# Patient Record
Sex: Female | Born: 1950 | Race: White | Hispanic: No | Smoking: Former smoker
Health system: Southern US, Community
[De-identification: ages and names within clinical notes are randomized; demographics above are authoritative.]

## PROBLEM LIST (undated history)

## (undated) DIAGNOSIS — C801 Malignant (primary) neoplasm, unspecified: Secondary | ICD-10-CM

## (undated) DIAGNOSIS — M5416 Radiculopathy, lumbar region: Secondary | ICD-10-CM

## (undated) DIAGNOSIS — K759 Inflammatory liver disease, unspecified: Secondary | ICD-10-CM

## (undated) DIAGNOSIS — M79606 Pain in leg, unspecified: Secondary | ICD-10-CM

## (undated) DIAGNOSIS — C541 Malignant neoplasm of endometrium: Secondary | ICD-10-CM

## (undated) DIAGNOSIS — M7989 Other specified soft tissue disorders: Secondary | ICD-10-CM

## (undated) DIAGNOSIS — F32A Depression, unspecified: Secondary | ICD-10-CM

## (undated) DIAGNOSIS — M1990 Unspecified osteoarthritis, unspecified site: Secondary | ICD-10-CM

## (undated) DIAGNOSIS — I10 Essential (primary) hypertension: Secondary | ICD-10-CM

## (undated) DIAGNOSIS — I499 Cardiac arrhythmia, unspecified: Secondary | ICD-10-CM

## (undated) DIAGNOSIS — I81 Portal vein thrombosis: Secondary | ICD-10-CM

## (undated) DIAGNOSIS — K7460 Unspecified cirrhosis of liver: Secondary | ICD-10-CM

## (undated) DIAGNOSIS — K922 Gastrointestinal hemorrhage, unspecified: Secondary | ICD-10-CM

## (undated) DIAGNOSIS — F329 Major depressive disorder, single episode, unspecified: Secondary | ICD-10-CM

## (undated) DIAGNOSIS — M797 Fibromyalgia: Secondary | ICD-10-CM

## (undated) DIAGNOSIS — F419 Anxiety disorder, unspecified: Secondary | ICD-10-CM

## (undated) HISTORY — DX: Portal vein thrombosis: I81

## (undated) HISTORY — DX: Radiculopathy, lumbar region: M54.16

## (undated) HISTORY — DX: Gastrointestinal hemorrhage, unspecified: K92.2

## (undated) HISTORY — PX: MELANOMA EXCISION: SHX5266

## (undated) HISTORY — PX: BRAIN SURGERY: SHX531

## (undated) HISTORY — DX: Major depressive disorder, single episode, unspecified: F32.9

## (undated) HISTORY — DX: Unspecified osteoarthritis, unspecified site: M19.90

## (undated) HISTORY — PX: SPLENECTOMY, TOTAL: SHX788

## (undated) HISTORY — DX: Malignant (primary) neoplasm, unspecified: C80.1

## (undated) HISTORY — DX: Other specified soft tissue disorders: M79.89

## (undated) HISTORY — DX: Depression, unspecified: F32.A

## (undated) HISTORY — PX: ABDOMINAL HYSTERECTOMY: SHX81

## (undated) HISTORY — DX: Essential (primary) hypertension: I10

## (undated) HISTORY — DX: Inflammatory liver disease, unspecified: K75.9

## (undated) HISTORY — DX: Unspecified cirrhosis of liver: K74.60

## (undated) HISTORY — DX: Anxiety disorder, unspecified: F41.9

## (undated) HISTORY — DX: Malignant neoplasm of endometrium: C54.1

## (undated) HISTORY — DX: Cardiac arrhythmia, unspecified: I49.9

## (undated) HISTORY — DX: Pain in leg, unspecified: M79.606

## (undated) HISTORY — DX: Fibromyalgia: M79.7

---

## 2007-06-19 ENCOUNTER — Ambulatory Visit: Payer: Self-pay | Admitting: Gastroenterology

## 2008-05-13 ENCOUNTER — Ambulatory Visit: Payer: Self-pay

## 2008-08-02 ENCOUNTER — Ambulatory Visit: Payer: Self-pay | Admitting: Gastroenterology

## 2008-09-28 ENCOUNTER — Ambulatory Visit: Payer: Self-pay | Admitting: Internal Medicine

## 2010-06-19 ENCOUNTER — Ambulatory Visit: Payer: Self-pay | Admitting: Internal Medicine

## 2011-05-16 ENCOUNTER — Ambulatory Visit: Payer: Self-pay | Admitting: Internal Medicine

## 2012-10-20 DIAGNOSIS — B1920 Unspecified viral hepatitis C without hepatic coma: Secondary | ICD-10-CM | POA: Insufficient documentation

## 2012-12-01 IMAGING — CT CT ABDOMEN WO/W CM
2 of 4 series · 10 of 32 positions shown, 15 images · non-contrast
Comparison: none

REASON FOR EXAM: fibrosis
COMMENTS:

[Series 2: wo · axial · 0.79mm/px · z∈[+354,+554]mm · 8 of 52 slices shown, 13 images]
[im 6/52  soft-tissue]
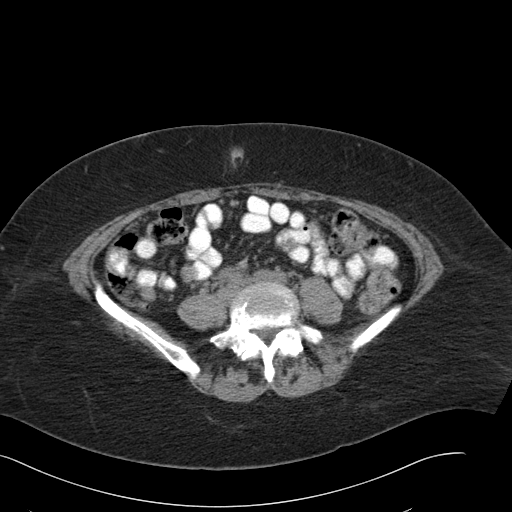
[im 6/52  bone]
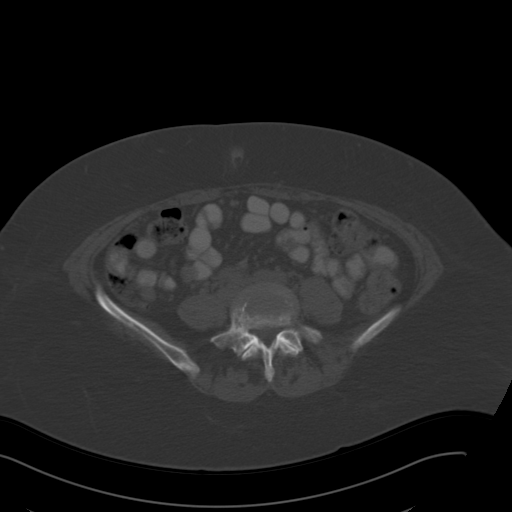
[im 12/52  soft-tissue]
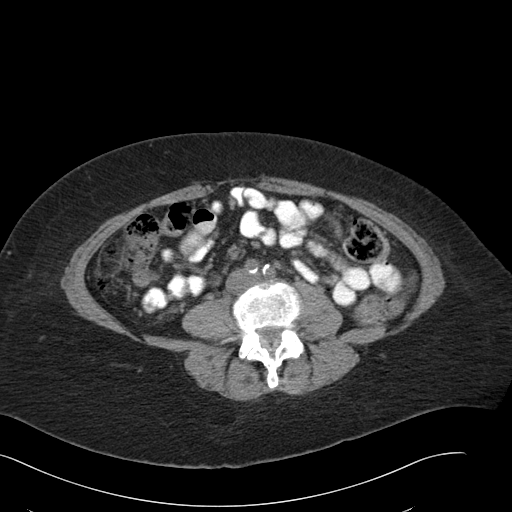
[im 18/52  soft-tissue]
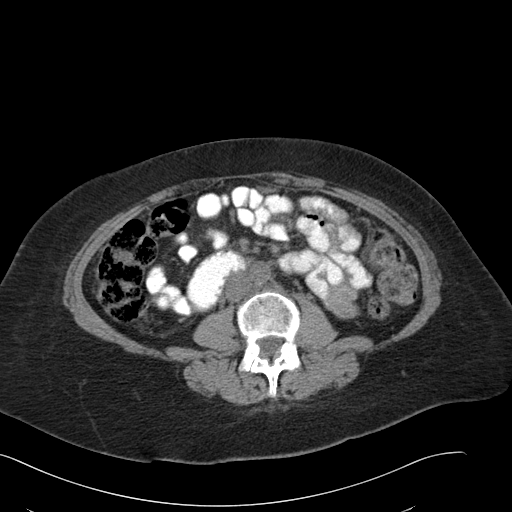
[im 23/52  soft-tissue]
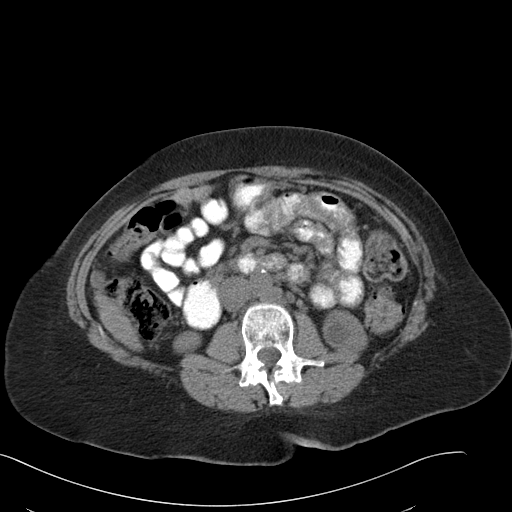
[im 29/52  soft-tissue]
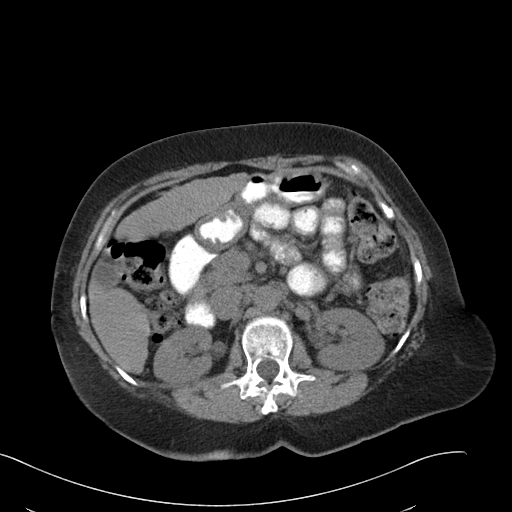
[im 29/52  lung]
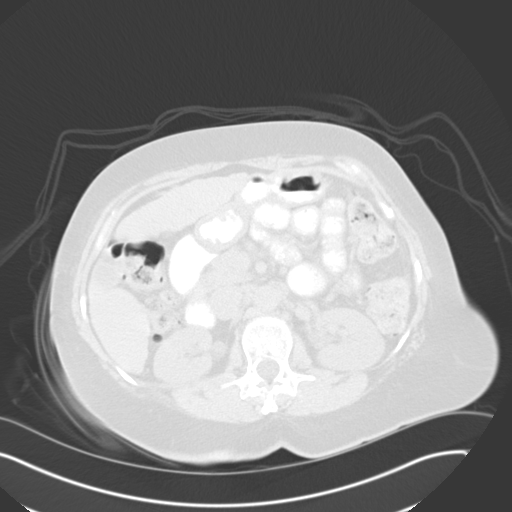
[im 35/52  soft-tissue]
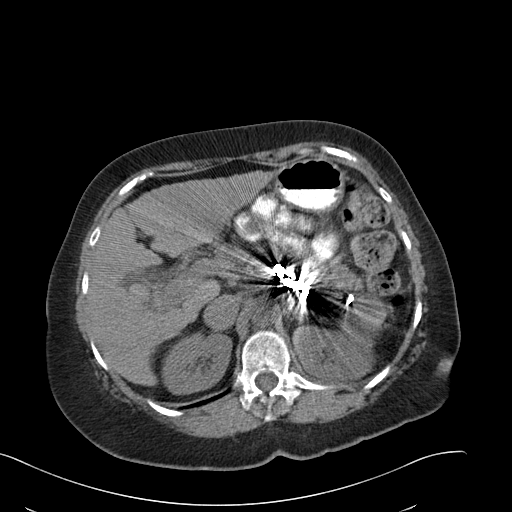
[im 35/52  lung]
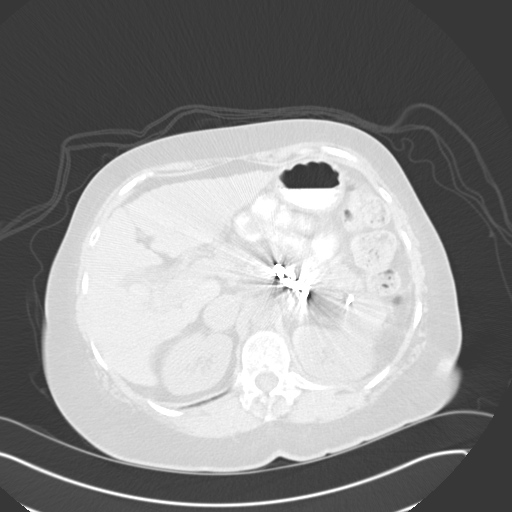
[im 40/52  soft-tissue]
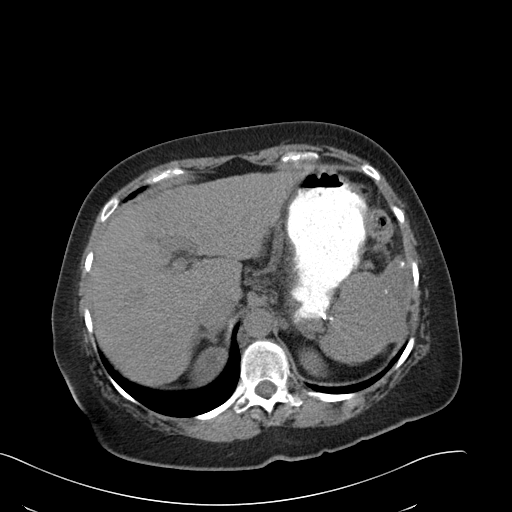
[im 40/52  lung]
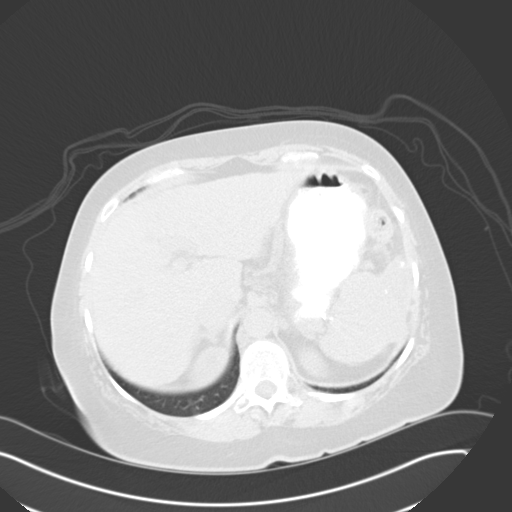
[im 46/52  soft-tissue]
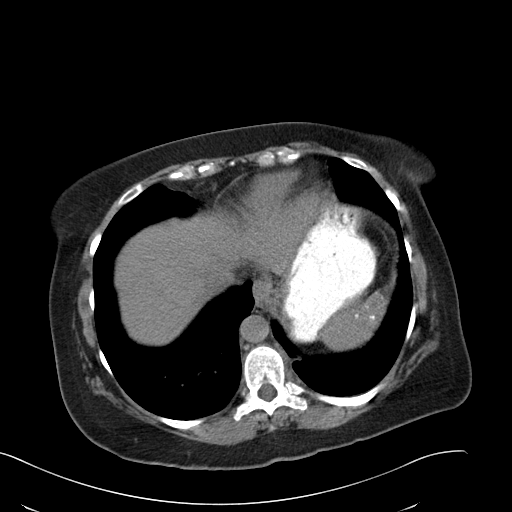
[im 46/52  lung]
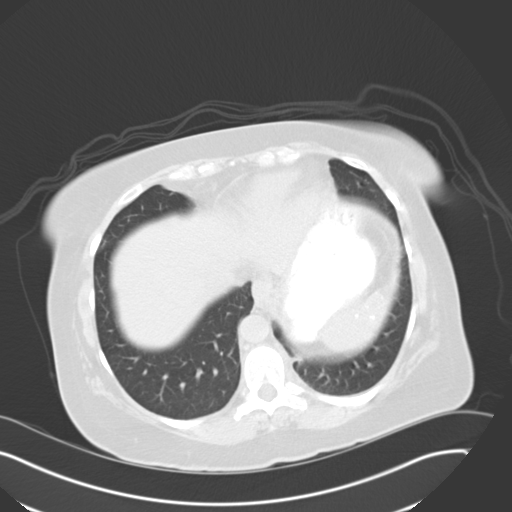

[Series 7: lung windows · axial · 0.79mm/px · z∈[+515,+550]mm · 2 of 21 slices shown]
[im 7/21  bone]
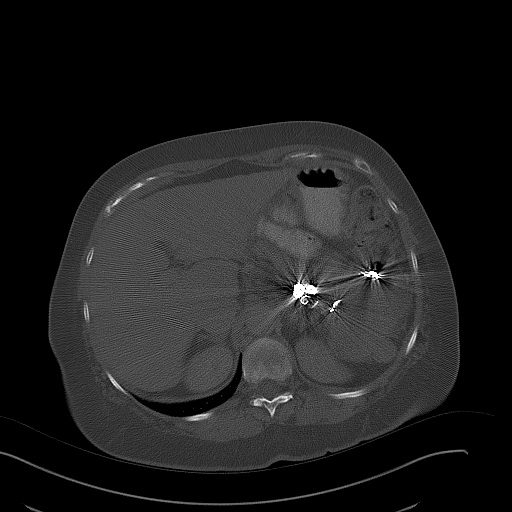
[im 14/21  bone]
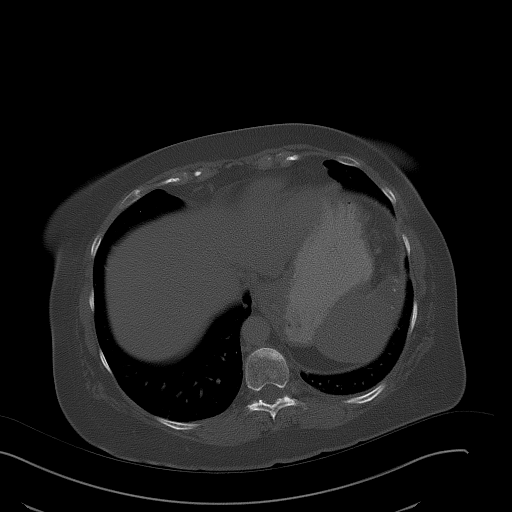

[10 of 32 positions shown; findings below may reference images not displayed]

PROCEDURE:     CT  - CT ABDOMEN STANDARD W/WO  - June 19, 2010  [DATE]

RESULT:     A triphasic CT scan was performed through the abdomen at 5 mm
intervals and slice thicknesses. For the postcontrast images the patient
received 100 cc of [id number]. The patient received oral contrast for all 3
phases of the scan. Review of multiplanar reconstructed images was performed
separately on the via monitor.

There is an abnormal appearance of the portal vein. Its caliber is increased
and there is central nonenhancing material consistent with thrombus. This is
seen on all 3 phases of the study. There appears to be opacified blood in
the superior mesenteric vein. The thrombus appears to begin at approximately
the level of the junction of the splenic vein with the superior mesenteric
vein and extends to the porta hepatis. There is an abnormal appearance of
the lateral aspect of the spleen. There are numerous surgical clips and
coils present presumably from the previous vascular intervention of the
spleen. The lateral aspect of the spleen is slightly hypodense precontrast,
is quite hypodense postcontrast, and becomes less hypodense on delayed
images. A similar structure was described on an ultrasound 19 June, 2007. There is an approximately 1.5 cm diameter accessory spleen
demonstrated on image 15 of the immediately postcontrast images.

The liver exhibits no focal mass nor intrahepatic ductal dilation. There is
likely a subcentimeter cyst at the junction of the right and left hepatic
lobes. The gallbladder is adequately distended with no calcified stones. The
partially distended stomach is grossly normal. I see no adrenal masses. The
caliber of the abdominal aorta is normal. There is a retroaortic left renal
vein. I see no evidence of ascites. Evaluation of the pancreas is quite
limited due to metallic beam hardening artifact.

On the noncontrast images the kidneys are normal in density and contour.
There is likely a lower pole cyst on the left measuring approximately 1.8 cm
in diameter. It has Hounsfield measurement of -11 precontrast, -5
immediately postcontrast, and -9 on delayed images. This is most compatible
with a cyst. Neither kidney exhibits evidence of hydronephrosis. I see no
calcified kidney stones and no perinephric fluid collections. The intrarenal
collecting systems appear normal on the delayed images.

The partially contrast-filled loops of small bowel exhibit no evidence of
ileus or obstruction. The visualized portions of the large bowel exhibits a
normal stool and gas pattern. The lung bases are clear. The lumbar vertebral
bodies are preserved in height.
IMPRESSION: 1. The findings are consistent with portal vein thrombosis. The portal vein
does exhibit some contrast surrounding the thrombus which likely indicates
some persistent normal flow. The age of this is unclear.
2. Numerous surgical coils are present associated with the spleen from
previous splenic artery embolic procedures. There is a small accessory
spleen.
3. I see no evidence of hepatic masses. Evaluation the pancreas is limited.
I see no abnormality of the kidneys or visualized portions of the bowel.

This report was called by me directly to Dr. Euclides Omr of the [facility] at approximately [DATE] p.m. on 19 June, 2010.

## 2013-03-01 DIAGNOSIS — I10 Essential (primary) hypertension: Secondary | ICD-10-CM | POA: Insufficient documentation

## 2013-04-08 ENCOUNTER — Encounter: Payer: Self-pay | Admitting: Family Medicine

## 2013-04-10 ENCOUNTER — Encounter: Payer: Self-pay | Admitting: Family Medicine

## 2013-04-26 DIAGNOSIS — K922 Gastrointestinal hemorrhage, unspecified: Secondary | ICD-10-CM | POA: Insufficient documentation

## 2013-04-26 DIAGNOSIS — M5136 Other intervertebral disc degeneration, lumbar region: Secondary | ICD-10-CM | POA: Insufficient documentation

## 2013-04-26 HISTORY — DX: Gastrointestinal hemorrhage, unspecified: K92.2

## 2013-08-04 DIAGNOSIS — F4310 Post-traumatic stress disorder, unspecified: Secondary | ICD-10-CM | POA: Insufficient documentation

## 2013-08-24 ENCOUNTER — Emergency Department: Payer: Self-pay | Admitting: Emergency Medicine

## 2013-08-25 LAB — URINALYSIS, COMPLETE
BILIRUBIN, UR: NEGATIVE
Glucose,UR: NEGATIVE mg/dL (ref 0–75)
Ketone: NEGATIVE
LEUKOCYTE ESTERASE: NEGATIVE
Nitrite: NEGATIVE
PH: 6 (ref 4.5–8.0)
SPECIFIC GRAVITY: 1.006 (ref 1.003–1.030)
Squamous Epithelial: 1
WBC UR: 13 /HPF (ref 0–5)

## 2013-08-25 LAB — BASIC METABOLIC PANEL
Anion Gap: 4 — ABNORMAL LOW (ref 7–16)
BUN: 23 mg/dL — AB (ref 7–18)
Calcium, Total: 8.5 mg/dL (ref 8.5–10.1)
Chloride: 102 mmol/L (ref 98–107)
Co2: 27 mmol/L (ref 21–32)
Creatinine: 1.14 mg/dL (ref 0.60–1.30)
EGFR (Non-African Amer.): 51 — ABNORMAL LOW
GFR CALC AF AMER: 60 — AB
Glucose: 161 mg/dL — ABNORMAL HIGH (ref 65–99)
Osmolality: 274 (ref 275–301)
Potassium: 4 mmol/L (ref 3.5–5.1)
Sodium: 133 mmol/L — ABNORMAL LOW (ref 136–145)

## 2013-08-25 LAB — CBC
HCT: 37.3 % (ref 35.0–47.0)
HGB: 12.7 g/dL (ref 12.0–16.0)
MCH: 35.2 pg — ABNORMAL HIGH (ref 26.0–34.0)
MCHC: 34.1 g/dL (ref 32.0–36.0)
MCV: 103 fL — ABNORMAL HIGH (ref 80–100)
Platelet: 156 10*3/uL (ref 150–440)
RBC: 3.62 10*6/uL — AB (ref 3.80–5.20)
RDW: 11.8 % (ref 11.5–14.5)
WBC: 6.4 10*3/uL (ref 3.6–11.0)

## 2013-10-14 DIAGNOSIS — C541 Malignant neoplasm of endometrium: Secondary | ICD-10-CM

## 2013-10-14 HISTORY — DX: Malignant neoplasm of endometrium: C54.1

## 2013-12-08 ENCOUNTER — Ambulatory Visit: Payer: Self-pay | Admitting: Internal Medicine

## 2013-12-21 LAB — CBC WITH DIFFERENTIAL/PLATELET
BASOS ABS: 0 10*3/uL (ref 0.0–0.1)
Basophil %: 0.4 %
Eosinophil #: 0 10*3/uL (ref 0.0–0.7)
Eosinophil %: 0.9 %
HCT: 43.6 % (ref 35.0–47.0)
HGB: 14.4 g/dL (ref 12.0–16.0)
Lymphocyte #: 1.5 10*3/uL (ref 1.0–3.6)
Lymphocyte %: 38.3 %
MCH: 33.1 pg (ref 26.0–34.0)
MCHC: 33.1 g/dL (ref 32.0–36.0)
MCV: 100 fL (ref 80–100)
MONOS PCT: 0.4 %
Monocyte #: 0 x10 3/mm — ABNORMAL LOW (ref 0.2–0.9)
NEUTROS PCT: 60 %
Neutrophil #: 2.4 10*3/uL (ref 1.4–6.5)
Platelet: 86 10*3/uL — ABNORMAL LOW (ref 150–440)
RBC: 4.37 10*6/uL (ref 3.80–5.20)
RDW: 12.8 % (ref 11.5–14.5)
WBC: 4 10*3/uL (ref 3.6–11.0)

## 2013-12-22 LAB — COMPREHENSIVE METABOLIC PANEL
Albumin: 2.8 g/dL — ABNORMAL LOW (ref 3.4–5.0)
Alkaline Phosphatase: 61 U/L
Anion Gap: 8 (ref 7–16)
BUN: 22 mg/dL — ABNORMAL HIGH (ref 7–18)
Bilirubin,Total: 2.3 mg/dL — ABNORMAL HIGH (ref 0.2–1.0)
Calcium, Total: 8 mg/dL — ABNORMAL LOW (ref 8.5–10.1)
Chloride: 101 mmol/L (ref 98–107)
Co2: 20 mmol/L — ABNORMAL LOW (ref 21–32)
Creatinine: 0.77 mg/dL (ref 0.60–1.30)
EGFR (African American): >60
EGFR (Non-African Amer.): >60
Glucose: 163 mg/dL — ABNORMAL HIGH (ref 65–99)
Osmolality: 266 (ref 275–301)
Potassium: 4.8 mmol/L (ref 3.5–5.1)
SGOT(AST): 62 U/L — ABNORMAL HIGH (ref 15–37)
SGPT (ALT): 48 U/L (ref 12–78)
Sodium: 129 mmol/L — ABNORMAL LOW (ref 136–145)
Total Protein: 7.3 g/dL (ref 6.4–8.2)

## 2013-12-22 LAB — URINALYSIS, COMPLETE
Bacteria: NONE SEEN
Bilirubin,UR: NEGATIVE
Glucose,UR: 50 mg/dL (ref 0–75)
Leukocyte Esterase: NEGATIVE
Nitrite: NEGATIVE
Ph: 6 (ref 4.5–8.0)
Protein: NEGATIVE
RBC,UR: <1 /HPF (ref 0–5)
Specific Gravity: 1.021 (ref 1.003–1.030)
Squamous Epithelial: 4
WBC UR: 4 /HPF (ref 0–5)

## 2013-12-22 LAB — AMMONIA: Ammonia, Plasma: 65 mcmol/L — ABNORMAL HIGH (ref 11–32)

## 2013-12-22 LAB — PROTIME-INR
INR: 1
Prothrombin Time: 13.1 secs (ref 11.5–14.7)

## 2013-12-22 LAB — TROPONIN I: Troponin-I: <0.02 ng/mL

## 2013-12-23 ENCOUNTER — Inpatient Hospital Stay: Payer: Self-pay | Admitting: Specialist

## 2013-12-23 LAB — CBC WITH DIFFERENTIAL/PLATELET
BASOS PCT: 0 %
Basophil #: 0 10*3/uL (ref 0.0–0.1)
EOS ABS: 0 10*3/uL (ref 0.0–0.7)
EOS PCT: 2.9 %
HCT: 35.3 % (ref 35.0–47.0)
HGB: 12 g/dL (ref 12.0–16.0)
LYMPHS ABS: 0.4 10*3/uL — AB (ref 1.0–3.6)
LYMPHS PCT: 93.6 %
MCH: 32.8 pg (ref 26.0–34.0)
MCHC: 34 g/dL (ref 32.0–36.0)
MCV: 97 fL (ref 80–100)
Monocyte #: 0 x10 3/mm — ABNORMAL LOW (ref 0.2–0.9)
Monocyte %: 0.9 %
NEUTROS ABS: 0 10*3/uL — AB (ref 1.4–6.5)
Neutrophil %: 2.6 %
PLATELETS: 59 10*3/uL — AB (ref 150–440)
RBC: 3.66 10*6/uL — ABNORMAL LOW (ref 3.80–5.20)
RDW: 12.7 % (ref 11.5–14.5)
WBC: 0.4 10*3/uL — CL (ref 3.6–11.0)

## 2013-12-23 LAB — HEPATIC FUNCTION PANEL A (ARMC)
ALT: 36 U/L (ref 12–78)
Albumin: 2.5 g/dL — ABNORMAL LOW (ref 3.4–5.0)
Alkaline Phosphatase: 54 U/L
BILIRUBIN TOTAL: 1.7 mg/dL — AB (ref 0.2–1.0)
Bilirubin, Direct: 0.9 mg/dL — ABNORMAL HIGH (ref 0.00–0.20)
SGOT(AST): 36 U/L (ref 15–37)
Total Protein: 5.9 g/dL — ABNORMAL LOW (ref 6.4–8.2)

## 2013-12-23 LAB — BASIC METABOLIC PANEL
ANION GAP: 7 (ref 7–16)
BUN: 15 mg/dL (ref 7–18)
CO2: 21 mmol/L (ref 21–32)
CREATININE: 0.97 mg/dL (ref 0.60–1.30)
Calcium, Total: 7.6 mg/dL — ABNORMAL LOW (ref 8.5–10.1)
Chloride: 103 mmol/L (ref 98–107)
EGFR (African American): >60
Glucose: 209 mg/dL — ABNORMAL HIGH (ref 65–99)
OSMOLALITY: 270 (ref 275–301)
POTASSIUM: 4.2 mmol/L (ref 3.5–5.1)
SODIUM: 131 mmol/L — AB (ref 136–145)

## 2013-12-23 LAB — CLOSTRIDIUM DIFFICILE(ARMC)

## 2013-12-24 LAB — CBC WITH DIFFERENTIAL/PLATELET
BASOS PCT: 0 %
Basophil #: 0 10*3/uL (ref 0.0–0.1)
Eosinophil #: 0 10*3/uL (ref 0.0–0.7)
Eosinophil %: 0.5 %
HCT: 29.3 % — ABNORMAL LOW (ref 35.0–47.0)
HGB: 9.8 g/dL — ABNORMAL LOW (ref 12.0–16.0)
LYMPHS PCT: 92.1 %
Lymphocyte #: 0.4 10*3/uL — ABNORMAL LOW (ref 1.0–3.6)
MCH: 32.8 pg (ref 26.0–34.0)
MCHC: 33.6 g/dL (ref 32.0–36.0)
MCV: 98 fL (ref 80–100)
MONOS PCT: 6.2 %
Monocyte #: 0 x10 3/mm — ABNORMAL LOW (ref 0.2–0.9)
Neutrophil #: 0 10*3/uL — ABNORMAL LOW (ref 1.4–6.5)
Neutrophil %: 1.2 %
Platelet: 28 10*3/uL — CL (ref 150–440)
RBC: 3 10*6/uL — AB (ref 3.80–5.20)
RDW: 12.7 % (ref 11.5–14.5)
WBC: 0.4 10*3/uL — CL (ref 3.6–11.0)

## 2013-12-24 LAB — BASIC METABOLIC PANEL
Anion Gap: 4 — ABNORMAL LOW (ref 7–16)
BUN: 18 mg/dL (ref 7–18)
Calcium, Total: 7.4 mg/dL — ABNORMAL LOW (ref 8.5–10.1)
Chloride: 107 mmol/L (ref 98–107)
Co2: 22 mmol/L (ref 21–32)
Creatinine: 1 mg/dL (ref 0.60–1.30)
Glucose: 145 mg/dL — ABNORMAL HIGH (ref 65–99)
OSMOLALITY: 271 (ref 275–301)
POTASSIUM: 3.5 mmol/L (ref 3.5–5.1)
Sodium: 133 mmol/L — ABNORMAL LOW (ref 136–145)

## 2013-12-24 LAB — PLATELET COUNT: PLATELETS: 38 10*3/uL — AB (ref 150–440)

## 2013-12-24 LAB — CREATININE, SERUM
CREATININE: 1.18 mg/dL (ref 0.60–1.30)
EGFR (African American): 57 — ABNORMAL LOW
GFR CALC NON AF AMER: 49 — AB

## 2013-12-24 LAB — BILIRUBIN, TOTAL: Bilirubin,Total: 1 mg/dL (ref 0.2–1.0)

## 2013-12-25 LAB — CBC WITH DIFFERENTIAL/PLATELET
Basophil #: 0 10*3/uL (ref 0.0–0.1)
Basophil %: 0 %
EOS PCT: 0.7 %
Eosinophil #: 0 10*3/uL (ref 0.0–0.7)
HCT: 30 % — ABNORMAL LOW (ref 35.0–47.0)
HGB: 10 g/dL — ABNORMAL LOW (ref 12.0–16.0)
LYMPHS ABS: 0.5 10*3/uL — AB (ref 1.0–3.6)
LYMPHS PCT: 67.3 %
MCH: 32.6 pg (ref 26.0–34.0)
MCHC: 33.2 g/dL (ref 32.0–36.0)
MCV: 98 fL (ref 80–100)
MONO ABS: 0.2 x10 3/mm (ref 0.2–0.9)
Monocyte %: 31.6 %
Neutrophil #: 0 10*3/uL — ABNORMAL LOW (ref 1.4–6.5)
Neutrophil %: 0.4 %
PLATELETS: 36 10*3/uL — AB (ref 150–440)
RBC: 3.06 10*6/uL — AB (ref 3.80–5.20)
RDW: 12.4 % (ref 11.5–14.5)
WBC: 0.7 10*3/uL — CL (ref 3.6–11.0)

## 2013-12-25 LAB — STOOL CULTURE

## 2013-12-26 LAB — CBC WITH DIFFERENTIAL/PLATELET
HCT: 33.4 % — ABNORMAL LOW (ref 35.0–47.0)
HGB: 11 g/dL — ABNORMAL LOW (ref 12.0–16.0)
Lymphocytes: 47 %
MCH: 32.1 pg (ref 26.0–34.0)
MCHC: 33.1 g/dL (ref 32.0–36.0)
MCV: 97 fL (ref 80–100)
MONOS PCT: 23 %
Other Cells Blood: 6
Platelet: 43 10*3/uL — ABNORMAL LOW (ref 150–440)
RBC: 3.43 10*6/uL — ABNORMAL LOW (ref 3.80–5.20)
RDW: 12.7 % (ref 11.5–14.5)
SEGMENTED NEUTROPHILS: 14 %
Variant Lymphocyte - H1-Rlymph: 10 %
WBC: 3.3 10*3/uL — AB (ref 3.6–11.0)

## 2013-12-26 LAB — POTASSIUM: Potassium: 3.6 mmol/L (ref 3.5–5.1)

## 2013-12-27 LAB — CBC WITH DIFFERENTIAL/PLATELET
Bands: 6 %
Eosinophil: 2 %
HCT: 35.3 % (ref 35.0–47.0)
HGB: 11.6 g/dL — AB (ref 12.0–16.0)
LYMPHS PCT: 33 %
MCH: 32.2 pg (ref 26.0–34.0)
MCHC: 32.8 g/dL (ref 32.0–36.0)
MCV: 98 fL (ref 80–100)
Metamyelocyte: 1 %
Monocytes: 9 %
Other Cells Blood: 7
Platelet: 41 10*3/uL — ABNORMAL LOW (ref 150–440)
RBC: 3.59 10*6/uL — ABNORMAL LOW (ref 3.80–5.20)
RDW: 12.7 % (ref 11.5–14.5)
SEGMENTED NEUTROPHILS: 42 %
WBC: 6.3 10*3/uL (ref 3.6–11.0)

## 2013-12-27 LAB — URINE CULTURE

## 2013-12-28 LAB — CBC WITH DIFFERENTIAL/PLATELET
BANDS NEUTROPHIL: 6 %
COMMENT - H1-COM1: NORMAL
HCT: 33.5 % — ABNORMAL LOW (ref 35.0–47.0)
HGB: 11.2 g/dL — ABNORMAL LOW (ref 12.0–16.0)
LYMPHS PCT: 32 %
MCH: 32.3 pg (ref 26.0–34.0)
MCHC: 33.4 g/dL (ref 32.0–36.0)
MCV: 97 fL (ref 80–100)
METAMYELOCYTE: 1 %
MONOS PCT: 9 %
Myelocyte: 2 %
PROMYELOCYTE: 1 %
Platelet: 44 10*3/uL — ABNORMAL LOW (ref 150–440)
RBC: 3.46 10*6/uL — ABNORMAL LOW (ref 3.80–5.20)
RDW: 12.8 % (ref 11.5–14.5)
Segmented Neutrophils: 49 %
WBC: 9.5 10*3/uL (ref 3.6–11.0)

## 2013-12-28 LAB — CULTURE, BLOOD (SINGLE)

## 2014-02-11 ENCOUNTER — Ambulatory Visit: Payer: Self-pay | Admitting: Radiation Oncology

## 2014-03-10 ENCOUNTER — Ambulatory Visit: Payer: Self-pay | Admitting: Radiation Oncology

## 2014-03-14 LAB — CBC CANCER CENTER
Basophil #: 0 x10 3/mm (ref 0.0–0.1)
Basophil %: 0.5 %
Eosinophil #: 0.1 x10 3/mm (ref 0.0–0.7)
Eosinophil %: 1.5 %
HCT: 38.5 % (ref 35.0–47.0)
HGB: 12.4 g/dL (ref 12.0–16.0)
LYMPHS PCT: 33 %
Lymphocyte #: 1.1 x10 3/mm (ref 1.0–3.6)
MCH: 32.7 pg (ref 26.0–34.0)
MCHC: 32.1 g/dL (ref 32.0–36.0)
MCV: 102 fL — AB (ref 80–100)
Monocyte #: 0.3 x10 3/mm (ref 0.2–0.9)
Monocyte %: 9.4 %
Neutrophil #: 1.9 x10 3/mm (ref 1.4–6.5)
Neutrophil %: 55.6 %
PLATELETS: 156 x10 3/mm (ref 150–440)
RBC: 3.79 10*6/uL — AB (ref 3.80–5.20)
RDW: 13.4 % (ref 11.5–14.5)
WBC: 3.4 x10 3/mm — AB (ref 3.6–11.0)

## 2014-04-04 LAB — CBC CANCER CENTER
BASOS ABS: 0 x10 3/mm (ref 0.0–0.1)
BASOS PCT: 0.2 %
EOS PCT: 2.3 %
Eosinophil #: 0.1 x10 3/mm (ref 0.0–0.7)
HCT: 38.7 % (ref 35.0–47.0)
HGB: 12.8 g/dL (ref 12.0–16.0)
Lymphocyte #: 0.6 x10 3/mm — ABNORMAL LOW (ref 1.0–3.6)
Lymphocyte %: 14.4 %
MCH: 33.8 pg (ref 26.0–34.0)
MCHC: 33.1 g/dL (ref 32.0–36.0)
MCV: 102 fL — ABNORMAL HIGH (ref 80–100)
Monocyte #: 0.4 x10 3/mm (ref 0.2–0.9)
Monocyte %: 11.4 %
NEUTROS ABS: 2.8 x10 3/mm (ref 1.4–6.5)
Neutrophil %: 71.7 %
PLATELETS: 141 x10 3/mm — AB (ref 150–440)
RBC: 3.79 10*6/uL — ABNORMAL LOW (ref 3.80–5.20)
RDW: 13.6 % (ref 11.5–14.5)
WBC: 3.9 x10 3/mm (ref 3.6–11.0)

## 2014-04-10 ENCOUNTER — Ambulatory Visit: Payer: Self-pay | Admitting: Radiation Oncology

## 2014-05-10 ENCOUNTER — Ambulatory Visit: Payer: Self-pay | Admitting: Radiation Oncology

## 2014-06-10 ENCOUNTER — Ambulatory Visit: Payer: Self-pay | Admitting: Radiation Oncology

## 2014-10-01 NOTE — Consult Note (Signed)
Chief Complaint:  Subjective/Chief Complaint SEE IMP AND PLAN   Assessment/Plan:  Assessment/Plan:  Assessment SEE ALSO BRIEF CONSULT NOTE,  addendum, patient stating red urine and reiterated blood in stools, is a new problem..   Plan SEE ALSO DICTATED NOTE.  I ADDED A REPEAT U/A AND LIVER PANEL IN AM TO CBC AND MET B   Electronic Signatures: Dallas Schimke (MD)  (Signed 15-Jul-15 19:02)  Authored: Chief Complaint, Assessment/Plan   Last Updated: 15-Jul-15 19:02 by Dallas Schimke (MD)

## 2014-10-01 NOTE — Consult Note (Signed)
Chief Complaint:  Subjective/Chief Complaint GENERAL WEAKNESS, PARTICULARLY LOWERE EXT   SOB ON EXERTION GOING TO COMMODE NOT AT REST  NO CP   VITAL SIGNS/ANCILLARY NOTES: **Vital Signs.:   20-Jul-15 14:00  Vital Signs Type Routine  Temperature Temperature (F) 98.4  Celsius 36.8  Temperature Source oral  Pulse Pulse 65  Respirations Respirations 20  Systolic BP Systolic BP 99  Diastolic BP (mmHg) Diastolic BP (mmHg) 60  Mean BP 73  Pulse Ox % Pulse Ox % 100  Pulse Ox Activity Level  At rest  Oxygen Delivery Room Air/ 21 %   Brief Assessment:  GEN well developed, well nourished   Cardiac Regular  murmur present  LSB   Respiratory normal resp effort  DECREASED BS RIGHT BASE   Gastrointestinal NOT TENDER   Additional Physical Exam LOWER EXT  WEAK, CAN LIFT AGAINST GRAVITY BUT NOT AGAINST LIGHT RESISTANCE, ALERT AND COOPERATIVE, UPPER EXT 4/5 STRENGTH   Lab Results: Routine Chem:  20-Jul-15 04:14   Result Comment WBC/PLATELET - VERIFIED BY SODIUM CITRATE TUBE  - NO CLUMPS SEEN  - SMUDGE CELLS SEEN  Result(s) reported on 27 Dec 2013 at 07:46AM.  Routine Hem:  20-Jul-15 04:14   WBC (CBC) 6.3  RBC (CBC)  3.59  Hemoglobin (CBC)  11.6  Hematocrit (CBC) 35.3  Platelet Count (CBC)  41  MCV 98  MCH 32.2  MCHC 32.8  RDW 12.7  Bands 6  Segmented Neutrophils 42  Lymphocytes 33  Monocytes 9  Eosinophil 2  Metamyelocyte 1  Other Cells 7  Diff Comment 1 SMUDGE CELLS  Diff Comment 2 PLTS VARIED IN SIZE  Diff Comment 3 ANISOCYTOSIS  Diff Comment 4 POIKILOCYTOSIS  Diff Comment 5 ELLIPTOCYTES  Diff Comment 6 MACROCYTES PRESENT  Result(s) reported on 27 Dec 2013 at 07:46AM.   Assessment/Plan:  Assessment/Plan:  Assessment 1. HGB OK MILD ANEMIC.  NETROPHILS RECOVERING OK TO HOLD NEUPOGEN.  PLTS HOLDING BUT NOT IMPROVED SINCE YESTERDAY.   2.  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 PRIOR EPISODE BLOOD PER RECTUM AT  ADMISSION HAS NOT RECURRED  4. CIRRHOSIS, HAS NOT TAKEN PO HARVONI   5. MURMUR LSB, NOT PRIOR NOTED, SOBOE, SOME DECREASED BS RIGHT BASE, GOOD 02 SAT   Plan SUGG CXR AND CARDIAC ECHO.  DEFER TO MEDICINE IF GI EVAL NEEDED FOR BLEED OR ADVISE RE CIRRHOSIS, TX.  OK TO STOP NEUPOGEN TODAY BUT WATCH DAILY CBC, POLYS COULD DROP AGAIN   Electronic Signatures: Dallas Schimke (MD)  (Signed 20-Jul-15 18:35)  Authored: Chief Complaint, VITAL SIGNS/ANCILLARY NOTES, Brief Assessment, Lab Results, Assessment/Plan   Last Updated: 20-Jul-15 18:35 by Dallas Schimke (MD)

## 2014-10-01 NOTE — Consult Note (Signed)
PATIENT NAME:  Elizabeth Monroe, Elizabeth Monroe MR#:  [id number] DATE OF BIRTH:  [birth year]  DATE OF CONSULTATION:  12/22/2013  REFERRING PHYSICIAN:   CONSULTING PHYSICIAN:  Simonne Come. Inez Pilgrim, MD  REPORT OF CONSULTATION: This is a 64 year old patient who was admitted on July 15th. She states primary care with Dr. Ellin Goodie. The patient takes GYN oncology followup with Dr. Theora Gianotti at [facility]. The patient was admitted with body aches and decreased p.o. intake, some dehydration, nausea. She was found to be mildly hyponatremic. She has been admitted and given intravenous fluid support. She feels much better than on admission. The patient's significant history is that she is undergoing chemotherapy. She had an endometrial cancer recently diagnosed and had a hysterectomy. She says she was staged with a PET scan and that the tumor was in the lymph glands. Her chemotherapy is paclitaxel plus carboplatin. She was unsure how many cycles she was to receive.  Day 1 of cycle 1 was on Thursday the 9th. She did not have Neulasta. The patient developed some numbness and pain in the feet characteristic of chemotherapy and was told to take Claritin.  Today's hospitalization is, in my opinion, consistent with muscle aches and general weakness associated with this chemotherapy regimen. The patient has not been vomiting, and on my review, denied headache or dizziness, denied chills or sweats, denied fever. No visual disturbance, no soreness in her mouth or stomatitis.   There is a history elicited by admitting physician of GI bleed with bright red blood per rectum. There is apparently some blood in the stool at admission. Joint pains are already a bit better, still has leg ache.  There has been no easy bleeding or bruising. No cough or wheezing. No shortness of breath, chest pain or palpitations. No focal weakness. There is burning, stinging pain in the feet consistent with neuropathy. No rash or bruising.   PAST HISTORY:  Alcohol abuse,  degenerative joint disease, hypertension, esophageal varices, chronic renal disease, cirrhosis, hepatitis C. 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 Patient did not give me history of melanoma on my review.   ALLERGIES: No known allergies.   MEDICATIONS: Spironolactone and Restoril, propranolol twice a day, Phenergan 25 a day, oxycodone 5 mg twice a day, Nexium 40 mg daily, Neurontin 300 mg 3 times a day, multivitamin daily, milk thistle daily, losartan 25 mg a day, Lasix 40 mg a day, lactulose daily, hydroxyzine daily.  Not listed by admitting physician but confirmed by the patient, she is on Harvoni p.o. daily, recently started for hepatitis C.   PHYSICAL EXAMINATION:  GENERAL: Was alert and cooperative, no acute distress.  HEENT: No thrush in the mouth. No jaundice.  HEENT: Sclerae clear. No palpable lymph nodes in the neck, supraclavicular, submandibular or axilla.  LUNGS: Clear. No wheezing or rales.  HEART: Regular.  ABDOMEN: Nontender, not distended, no palpable mass.  EXTREMITIES: With no significant edema. No rash.  NEUROLOGIC: Grossly nonfocal. Cranial nerves intact. I did not test the gait. She is moving all extremities against gravity.   LABORATORY AND RADIOLOGICAL DATA:   The admission white count is 4.0.  The neutrophil count is 2.4. The hemoglobin is 14.4, the hematocrit is 43.6, the platelets are 86,000. Troponin was low. Creatinine was 0.77, the sodium was 1.29, the total bilirubin was 2.3. The calcium was 8.0. The SGPT was 48, the SGOT was 62, the total protein was 7.3, the albumin was 2.8. Chest x-ray showed embolization coils in the left upper quadrant considered to  be in the splenic vasculature.  The pro time was 1.0.  The plasma ammonia was 65. The urinalysis was fairly unremarkable.  There was 1+ blood.  A KUB showed constipation.   IMPRESSION AND PLAN:  1. The patient has history of endometrial cancer is under the care of [facility] with Dr. Theora Gianotti. Is now into  the 1st cycle, day 7. Systemic symptoms, aches and pains, joint and muscle pains are consistent with that treatment.  2. There is an elevated bilirubin and liver enzymes and there is an abnormal ammonia level. This is consistent with history of cirrhosis but I do not have the patient's baseline labs.  Potentially, there could be toxicity from Taxotere and liver chemistries need to be checked serially.  3. Neutrophil count was good and platelets are 86,000. This could be chemotherapy toxicity and serial CBCs to be checked. Ultimately, this could be the patient's baseline from cirrhosis.  4. History of splenic aneurysm and there is x-ray evidence of coils and apparently splenic vasculature. Defer to primary care to review history and determine if this finding is consistent with the previous or known vascular intervention.  5. History of blood per rectum on admission. Could be hemorrhoidal or diverticular. Would defer to medicine and to consult gastrointestinal for gastrointestinal pathology.  6. There is nothing acutely otherwise to offer from oncology. The patient has care in place. Has a history of cirrhosis, recent chemotherapy as noted. No neutropenia currently. Other medical issues.   SUGGESTIONS: 1. Follow daily CBC. Watch for significant thrombocytopenia. Watch for neutropenia.  If absolute neutropenia develops, may need prophylactic antibiotics. Would contact Dr. Theora Gianotti at [facility] for their usual practice and their antibiotic of choice.  2. If patient were to develop neutropenia and fever, would treat empirically with broad-spectrum antibiotics, vancomycin and cefepime or vancomycin and Zosyn after confirming there are no allergies.  3. Would consult GI for blood per rectum.  4. Would repeat liver chemistries looking for any possible taxing toxicity in the liver functions.  5. Please note would defer to medicine about starting Harvoni which the patient has been started on daily but did not take  today's medication.  6. I would note that patient has stated her next appointment is at [facility] in 3 weeks' time with no  determination for mid cycle labs. Given the elevated liver functions and we do not know the baseline and there is mild thrombocytopenia and the potential for cytopenia, I would recommend that if this patient is released this week that patient should have a CBC and liver chemistry at [facility] with primary care at [facility] in [place] or with her oncologist at [facility] on or around Monday, July 20th and Thursday, July 23rd for the purposes of monitoring for toxicity.   Nothing else to add acutely from oncology.    ____________________________ Simonne Come Inez Pilgrim, MD rgg:dd D: 12/22/2013 18:54:44 ET T: 12/22/2013 19:35:25 ET JOB#: [id number]  cc: Simonne Come. Inez Pilgrim, MD, <Dictator> Dallas Schimke MD ELECTRONICALLY SIGNED 12/23/2013 13:16

## 2014-10-01 NOTE — H&P (Signed)
PATIENT NAME:  Elizabeth Monroe, Elizabeth Monroe MR#:  [id number] DATE OF BIRTH:  Mar 26, 1951  DATE OF ADMISSION:  12/22/2013  PRIMARY CARE PHYSICIAN:  Dr. Ellin Goodie.  REFERRING PHYSICIAN:  Dr. Lurline Hare.  CHIEF COMPLAINT:  Pain all over the body, generalized weakness.  HISTORY OF PRESENT ILLNESS:  Elizabeth Monroe is a 64 year old female with a history of cirrhosis, hepatitis C, was diagnosed with endometrial cancer, underwent a hysterectomy, undergoing chemotherapy.  The patient states has received chemotherapy on Thursday.  Starts to experience pain all over the body, decreased by mouth intake associated with nausea.  Concerning this, came to the Emergency Department.  Workup in the Emergency Department, the patient is found to have sodium of 129, the rest of all the values are within normal limits.  No obvious signs of any infection were noted.  The patient noticed to have some blood in the stool.  Has been nauseated, no episodes of vomiting.    PAST MEDICAL HISTORY:   1.  Previous history of alcohol abuse. 2.  Degenerative joint disease. 3.  Hypertension. 4.  History of esophageal varices. 5.  Chronic kidney disease. 6.  Cirrhosis. 7.  Hepatitis C. 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 9.  Melanoma.  ALLERGIES:  No known drug allergies.  HOME MEDICATIONS:  1.  Spirolactone 100 mg once a day. 2.   to the affected area. 3.  Restoril 7.5 mg two capsules at bedtime. 4.  Propranolol 10 mg two times a day.  5.  Phenergan 25 mg every 6 hours as needed. 6.  Oxycodone 5 mg two times a day. 7.  Nexium 40 mg once a day. 8.  Neurontin 300 mg three times a day. 9.  Multivitamin daily. 10.  Milk thistle oral tablet once a day. 11.  Losartan 25 mg once a day. 12.  Lasix 40 mg once a day. 13.  Lactulose daily. 14.  Hydroxyzine orally.  SOCIAL HISTORY:  No history of smoking.  Quit drinking when the patient was 56.  Denies using any illicit drugs.  Lives with her daughter.  FAMILY HISTORY:  Diabetes mellitus.  Mother  with brain lesions.  REVIEW OF SYSTEMS:  CONSTITUTIONAL:  Generalized weakness.  EYES:  No change in vision. EARS, NOSE, THROAT:  No change in hearing. RESPIRATORY:  No cough, shortness of breath. CARDIOVASCULAR:  No chest pain, palpitations. GASTROINTESTINAL:  Has nausea, GI bleed, bright red blood per rectum. GENITOURINARY:  No dysuria or hematuria. HEMATOLOGY:  No easy bruising or bleeding. SKIN:  No rash or lesions. MUSCULOSKELETAL:  Has joint pains all over the body. NEUROLOGIC:  No weakness or numbness in any part of the body.  PHYSICAL EXAMINATION:  GENERAL:  This is a well-built, well-nourished age-appropriate female lying down in the bed not in distress. VITAL SIGNS:  Temperature 97.8, pulse 72, blood pressure 109/66, respiratory rate of 18, oxygen saturation is 99% on room air.  HEENT:  Head normocephalic, atraumatic.  Eyes, no scleral icterus.  Conjunctivae normal.  Pupils equal and react to light.  Extraocular movements are intact.  Mucous membranes dry.  No pharyngeal erythema. NECK:  Supple.  No lymphadenopathy.  No JVD.  No carotid bruit. CHEST:  Has no focal tenderness. LUNGS:  Bilateral clear to auscultation. HEART:  S1, S2 regular.  No murmurs are heard. ABDOMEN:  Bowel sounds plus, soft, nontender, nondistended.  EXTREMITIES:  No pedal edema.  Pulses 2+. SKIN:  No rash or lesions.  NEUROLOGIC:  The patient is alert, oriented to place, person  and time.  Cranial nerves II through XII intact.  Motor 5  by 5 in upper and lower extremities.  LABORATORY DATA:  CMP, BUN 22, creatinine of 0.77, bicarb 20, the rest of all the LFTs are within normal limits.    CBC is completely within normal limits.   Coag profile, PT 13, INR of 1.  Ammonia level of 65.   ASSESSMENT AND PLAN:  Elizabeth Monroe is a 64 year old female with a known history of cirrhosis, recent diagnosis of endometrial cancer comes with generalized weakness, low sodium and dehydration. 1.  Hyponatremia.  This  could be secondary to dehydration.  Continue with intravenous fluids and follow up.  2.  Generalized weakness.  We will involve the physical therapy, occupational therapy. 3.  History of cirrhosis.  Continue with home medications.  4.  Generalized body aches.  No obvious source of infection is noted.  We will continue to follow up.  Provide symptomatic management.  5.  Keep the patient on deep vein thrombosis with sequential compression device.  TIME SPENT:  50 minutes.   ____________________________ Monica Becton, MD pv:ea D: 12/22/2013 03:21:24 ET T: 12/22/2013 04:20:34 ET JOB#: [id number]  cc: Monica Becton, MD, <Dictator> Monica Becton MD ELECTRONICALLY SIGNED 01/02/2014 21:09

## 2014-10-01 NOTE — Discharge Summary (Signed)
PATIENT NAME:  Elizabeth Monroe, Elizabeth Monroe MR#:  [id number] DATE OF BIRTH:  1950-12-03  DATE OF ADMISSION:  12/22/2013 DATE OF DISCHARGE:  12/29/2013  For a detailed note, please take a look at the history and physical done on admission by Dr. Lunette Stands.     DIAGNOSES AT DISCHARGE: Are as follows: Neutropenic fevers, now resolved. Urinary tract infection, secondary to Klebsiella and Escherichia coli. Chronic abdominal and back pain. History of endometrial cancer. Constipation. History of hep C with liver cirrhosis. Hypertension.   The patient is being discharged on a low-sodium diet.   ACTIVITY: As tolerated.   Follow up with Dr. Kingsley Spittle in the next 1 to 2 weeks.    DISCHARGE MEDICATIONS:  Lactulose 30 mL 3 to 4 times daily to achieve 2 or 3 bowel movements, losartan 25 mg daily, multivitamin daily, promethazine 25 mg q.6 hours as needed, propranolol 10 mg b.i.d., Aldactone 100 mg daily, Lasix 40 mg daily, ledipasvir-sofosbuvir  90 mg/400 mg 1 tab daily, omeprazole 20 mg daily, Zofran 8 mg 3 days post chemotherapy to prevent nausea, oxycodone 5 mg q.4 hours as needed for pain, psyllium as needed for constipation, Senokot with Colace 1 tab b.i.d., MS Contin 15 mg b.i.d.   CONSULTANTS DURING HOSPITAL COURSE: Dr. Barbette Reichmann from oncology, Dr. Izora Gala Phifer, palliative care.   PERTINENT STUDIES DONE DURING THE HOSPITAL COURSE: Are as follows: A chest x-ray done on admission showing no acute disease. Abdominal x-ray also showing prominent stool throughout the colon favoring constipation. A repeat chest x-ray done on July 20 showing no acute cardiopulmonary disease. The patient's urine cultures to be positive for Klebsiella and E. coli, which are pansensitive.   HOSPITAL COURSE: This is a 64 year old female with medical problems as mentioned above, presented to the hospital on 12/22/2013 secondary to abdominal pain and generalized weakness and noted to be hyponatremic and also noted to have neutropenia.   1.  Neutropenic fevers. The patient presented to the hospital with a white cell count as low as 0.4. She is currently receiving chemo for endometrial cancer at [facility]. This was likely the cause of the patient's neutropenia. An oncology consult was obtained. The patient was seen by Dr. Inez Pilgrim. He ended up giving the patient Neupogen, and her neutropenia since getting Neupogen has now resolved. Her fever curve has significantly improved. The source of her fever was likely urinary tract infection, which has adequately been treated with oral and IV antibiotics. The patient has been afebrile and hemodynamically stable now for the past 48 hours.  2.  Hyponatremia. This was secondary to dehydration. The patient was hydrated with IV fluids and hyponatremia is now resolved.  3. History of liver cirrhosis hep C. The patient has had no evidence of hepatic encephalopathy. She has been maintained on lactulose. She will continue that. She has also been maintained on Lasix and Aldactone and her propranolol. She will continue all those medications.  4.  Chronic pain; this is secondary to her cancer. The patient has been maintained on some MS Contin and oxycodone. She will continue that.  5.  Constipation; this is secondary to high-dose narcotics.  This has improved with the lactulose and Senokot, as mentioned.  6.  History of endometrial cancer. The patient follows up with [facility] and will continue followup there.  7.  Hypertension. The patient has remained hemodynamically stable. She will continue her losartan as stated.   The patient is a FULL CODE.   Due to her profound weakness, she  is being discharged to a skilled nursing facility for ongoing rehab.   Time spent on discharge is 40 minutes.   ____________________________ Belia Heman. Verdell Carmine, MD vjs:dmm D: 12/29/2013 12:19:00 ET T: 12/29/2013 12:36:00 ET JOB#: [id number]  cc: Ellamae Sia, MD Belia Heman. Verdell Carmine, MD, <Dictator> Henreitta Leber  MD ELECTRONICALLY SIGNED 01/06/2014 14:05

## 2014-10-01 NOTE — Consult Note (Signed)
Reason for Visit: This 64 year old Female patient presents to the clinic for initial evaluation of  uterine cancer .   Referred by Dr. Christel Mormon [facility] radiation oncology.  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  Pathology Report pathology report reviewed   Imaging Report past CT scans reviewed   Referral Report clinical notes reviewed   Planned Treatment Regimen whole pelvic radiation therapy plus intravaginal brachy therapy   HPI   patient is a 64 year old female with significant medical comorbidities including hepatitis C, cirrhosis of the liver previous history of thigh melanoma and substance abuse who presented with postmenopausal bleeding. Initial biopsy was positive for endometrioid adenocarcinoma. Went on to have total abdominal hysterectomy and bilateral salpingo-oophorectomy. 2 right pelvic lymph nodes contain metastatic adenocarcinoma. Tumor size was 6 x 4.5 x 3.8 cm with myometrial invasion 2.6 cm of 2.8 wall thickness. Tumor invaded the cervical stroma. Patient was referred to medical oncology and was planned for adjuvant chemotherapy therapy and radiation therapy at [facility]. She underwent one cycle of carbotaxol on 12/16/2013 although was admitted to [facility] with neutropenic fever as well as hyponatremia and was given Neulasta. at this time [facility] has decided to go ahead and start with radiation therapy since she is unable to tolerate any further chemotherapy. She is seen today for initial consultation at our center which is closer to her home and makes transportation easier. She is quite nauseated today she states she gets nausea for her pain medication which he uses for back pain. She is having no vaginal bleeding. She tends towards  constipation.  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:    melanoma lesion excised:   Past, Family and Social History:  Past Medical History positive   Cardiovascular hypertension   Gastrointestinal liver cirrhosis, esophageal varices   Genitourinary chronic kidney disease   Neurological/Psychiatric CVA; depression; stroke in 1994   Infections hepatitis C   Past Medical History Comments degenerative disc disease, thigh melanoma removed, arthritis   Family History positive   Family History Comments family history of breast cancer in her mother brother with heart disease paternal grandfather with skin cancer also family history colon cancer ovarian cancer and malignant hypertension.   Social History positive   Social History Comments strong EtOH abuse history in the past greater than 40-pack-year smoking history   Additional Past Medical and Surgical History seen by herself today   Allergies:   No Known Allergies:   Home Meds:  Home Medications: Medication Instructions Status  MS Contin 15 mg/12 hr oral tablet, extended release 1 tab(s) orally 2 times a day Active  oxycodone 5 mg oral tablet 1 tab(s) orally every 4 hours, As Needed - for Pain  Active  docusate-senna 50 mg-8.6 mg oral tablet 1 tab(s) orally every 12 hours Active  furosemide 40 mg oral tablet 1 tab(s) orally once a day Active  ledipasvir-sofosbuvir 90 mg-400 mg oral tablet 1 tab(s) orally once a day Active  omeprazole 20 mg oral delayed release capsule 1 cap(s) orally once a day Active  psyllium 3.4 g/3.7 g  oral powder for reconstitution  orally , As Needed - for Constipation Active  lactulose 10 g/15 mL oral syrup  orally take 30 mL by mouth 3-4 times a day to achieve 2 to 3 bowel movements.  Active  losartan 25 mg oral tablet 1 tab(s) orally once  a day Active  MVI daily  Active  promethazine 25 mg oral tablet 1 tab(s) orally every 6 hours, As Needed Active  propranolol 10 mg oral tablet 1 tab(s) orally 2 times a day Active  spironolactone 100 mg oral tablet 1 tab(s) orally once a day Active  Roxicodone 5 mg oral tablet 1 tab(s) orally every 4 hours, As Needed - for Pain Active   Review of Systems:  General negative   Performance Status (ECOG) 0   Skin negative   Breast negative   Ophthalmologic negative   ENMT negative   Respiratory and Thorax negative   Cardiovascular negative   Gastrointestinal see HPI   Genitourinary see HPI   Musculoskeletal negative   Neurological negative   Psychiatric negative   Hematology/Lymphatics negative   Endocrine negative   Allergic/Immunologic negative   Review of Systems   aside from her nausea and problems with back pain patient complains of history of cirrhosis of liver and esophageal varices otherwisedenies any weight loss, fatigue, weakness, fever, chills or night sweats. Patient denies any loss of vision, blurred vision. Patient denies any ringing  of the ears or hearing loss. No irregular heartbeat. Patient denies heart murmur or history of fainting. Patient denies any chest pain or pain radiating to her upper extremities. Patient denies any shortness of breath, difficulty breathing at night, cough or hemoptysis. Patient denies any swelling in the lower legs. Patient denies any nausea vomiting, vomiting of blood, or coffee ground material in the vomitus. Patient denies any stomach pain. Patient states has had normal bowel movements no significant constipation or diarrhea. Patient denies any dysuria, hematuria or significant nocturia. Patient denies any problems walking, swelling in the joints or loss of balance. Patient denies any skin changes, loss of hair or loss of weight. Patient denies any excessive worrying or anxiety or significant depression. Patient denies any problems  with insomnia. Patient denies excessive thirst, polyuria, polydipsia. Patient denies any swollen glands, patient denies easy bruising or easy bleeding. Patient denies any recent infections, allergies or URI. Patient "s visual fields have not changed significantly in recent time.   Physical Exam:  General/Skin/HEENT:  Skin normal   Eyes normal   ENMT normal   Head and Neck normal   Additional PE well-developed obese female in NAD. Lungs are clear to A&P cardiac examination shows regular rate and rhythm abdomen is obese with no organomegaly or masses noted. On speculum examination vaginal vault is well healed no evidence of mass or nodularity in the vaginal apex is noted. Bimanual examination shows no evidence of mass or nodularity in the parametrial region rectal exam is unremarkable.   Breasts/Resp/CV/GI/GU:  Respiratory and Thorax normal   Cardiovascular normal   Gastrointestinal normal   Genitourinary normal   MS/Neuro/Psych/Lymph:  Musculoskeletal normal   Neurological normal   Lymphatics normal   Other Results:  Radiology Results: Nuclear Med:    06-Dec-12 14:57, Bone Scan Whole Body (Part 2 of 2)  Bone Scan Whole Body (Part 2 of 2)   REASON FOR EXAM:    possible malignant neoplasm  COMMENTS:       PROCEDURE: KNM - KNM BONE WB 3HR 2 OF 2  - May 16 2011  2:57PM     RESULT: Following intravenous administration of 23.08 mCi of technetium   5m MDP, total body bone scan was performed.Additional oblique views of   the spine and ribs were performed. There is a normal distribution of   tracer activity throughout the skeletal system. Specifically, no abnormal   foci of increased tracer activity suspicious for metastatic disease are   seen. Tracer activity is visualized in both kidneys.    IMPRESSION:   1. Essentially normal study. No findings suspicious for metastatic   disease are identified.  Thank you for the opportunity to contribute to the care of your patient.            Verified By: Dionne Ano WALL, M.D., MD   Relevent Results:   Relevant Scans and Labs prior bone scan and CT scans are reviewed   Assessment and Plan: Impression:   stage IIIc endometrioid adenocarcinoma status post TAH/BSO and one cycle of chemotherapy now for whole pelvis and vaginal brachy therapy in 64 year old female. Plan:   I agree with recommendations made at [facility]. Would like to start whole pelvic radiation therapy 4500 cGy over 5 weeks. Within posterior vaginal apex oozing high dose rate remote afterloading to 1200 cGy in 3 fractions. Risks and benefits of treatment including possible diarrhea, possible urinary frequency and urgency, alteration of blood counts, fatigue, skin reaction, all were explained in detail to the patient. She seems to comprehend my treatment plan well. I set her up for about a week's time for CT simulation.  I would like to take this opportunity for allowing me to participate in the care of your patient..  Electronic Signatures: Armstead Peaks (MD)  (Signed 04-Sep-15 11:59)  Authored: HPI, Diagnosis, Past Hx, PFSH, Allergies, Home Meds, ROS, Physical Exam, Other Results, Relevent Results, Encounter Assessment and Plan   Last Updated: 04-Sep-15 11:59 by Armstead Peaks (MD)

## 2014-10-01 NOTE — Consult Note (Signed)
Brief Consult Note: Diagnosis: endometrial cancer.   Patient was seen by consultant.   Comments: SEE DICTATED NOTE TO FOLLOW   HX OF ENDOMETRIAL CANCER. DX AND TX [facility] BY DR Theora Gianotti. REPORTS STAGING DONE BY PET SCAN. REPORTS DAY 1 OF CYCLE 1 OF CHEMOTX WITH TAXOTERE AND CARBOPLATIN WAS ON THURS JULY 9. HAS SOME MUSCLE ACHE CHARACTERISTIC OF TAXOTERE. HAS SOME NEUROPATHY FEET CHARACTERISTIC OF TAXOTERE. PLTS 86 K UNREMARKABLE, LOOKS LIKE CHEMOTX EFFECT, BASELINE CBC UNKNOWN. DID NOT RECIEVE NEULASTA. NOW WITH MILD HYPONATREMIA, BEING TX BY MEDICINE. Marland Kitchen   SUGGEST, 1. F/U DAILY CBC, TO WATCH FOR SIGNIFICANT THROMBOCYTOPENIA AND FOR NEUTROPENIA . 2..IF ABSOLUTE NEUTROPENIA DEVELOPS MAY NEED PROPHYLACTIC ANTIBIOTIC. SUGGEST CALL PRIMARY GYN ONCOLOGIST OR NP TO FOLLOW THEIR ROUTINE..3. WOULD ALSO CHECK MG LEVEL. F/U ALREADY IN PLACE AT [place], [place] NEXT APPT IS FOR F/U AND TX  ON JULY 30, WITH NO APPT FOR MIDCYCLE LAB CHECK..4. .THEREFORE I WOULD SUGGEST THAT IF REMAINS STABLE AND DISCHARGED LATER THIS WEEK, SHOULD HAVE CBC AT [facility] OR WITH PMD AT [facility] IN [place] ON MON JULY 20 AND THURS JULY 23. 5. PLEASE ALSO NOTE, PATIENT RECENTLY STARTED HARVONI PO DAILY FROM OWN SUPPLY MEDS FOR HEPATITIS C, HAS NOT HAD A TABLET TODAY  . NOTHING ELSE TO ADD FROM ONCOLOGY.  Electronic Signatures: Dallas Schimke (MD)  (Signed 15-Jul-15 13:57)  Authored: Brief Consult Note   Last Updated: 15-Jul-15 13:57 by Dallas Schimke (MD)

## 2014-10-01 NOTE — Consult Note (Signed)
Chief Complaint:  Subjective/Chief Complaint muscle joint pain, improved   VITAL SIGNS/ANCILLARY NOTES: **Vital Signs.:   17-Jul-15 04:22  Vital Signs Type Routine  Temperature Temperature (F) 98.4  Celsius 36.8  Temperature Source oral  Pulse Pulse 75  Respirations Respirations 20  Systolic BP Systolic BP 92  Diastolic BP (mmHg) Diastolic BP (mmHg) 59  Mean BP 70  Pulse Ox % Pulse Ox % 99  Pulse Ox Activity Level  At rest  Oxygen Delivery Room Air/ 21 %   Brief Assessment:  GEN well developed   Cardiac Regular   Respiratory normal resp effort   Gastrointestinal details normal Nontender   Lab Results: Hepatic:  17-Jul-15 04:19   Bilirubin, Total 1.0 (Result(s) reported on 24 Dec 2013 at 05:21AM.)  Routine Chem:  17-Jul-15 04:19   Creatinine (comp) 1.18  eGFR (African American)  57  eGFR (Non-African American)  49 (eGFR values <96m/min/1.73 m2 may be an indication of chronic kidney disease (CKD). Calculated eGFR is useful in patients with stable renal function. The eGFR calculation will not be reliable in acutely ill patients when serum creatinine is changing rapidly. It is not useful in  patients on dialysis. The eGFR calculation may not be applicable to patients at the low and high extremes of body sizes, pregnant women, and vegetarians.)  Result Comment WBC - RESULTS VERIFIED BY REPEAT TESTING.  - CRITICAL VALUE PREVIOUSLY NOTIFIED. DIFFERENTIAL - DUE TO THE LOW WBC, THE INSTRUMENT DIFF  - CANNOT BE CONFIRMED BY MANUAL DIFF AND  - IS REPORTED PRIMARILY TO IDENTIFY CELL  - TYPES PRESENT. PLATELETS - RESULTS VERIFIED BY REPEAT TESTING.  - NOTIFIED OF CRITICAL VALUE  - C/ JEFF LUSH _0  12-24-13..Marland KitchenJO  - READ-BACK PROCESS PERFORMED.  - VERIFIED BY SMEAR ESTIMATE  Result(s) reported on 24 Dec 2013 at 05:19AM.  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  neutropenic fever.  transient bump in bili likely taxane related possible infection related.  Now, temp down neutrophils zero, plts down to 28k from 59 k yesterday, blood culture still neg, urine culture 80,000 gm neg.  PLAN, CONTINUE NEUPOGEN, CONTINUE VANCO AND CEPAIPIME, F/U B/C AND U/C, IMAY D/V VANCO AFTER 48 HRS WITH GM NEG ORGANISM LIKELY CAUSATIVE, F/U CBC, TRANSFUSE PLTS PRN IF 20K OR LESS, USE IRRADIATED PRODUCT...WILL RECHECK PLTS COUNT THIS AFTERNOON   Electronic Signatures: GDallas Schimke(MD)  (Signed 17-Jul-15 13:11)  Authored: Chief Complaint, VITAL SIGNS/ANCILLARY NOTES, Brief Assessment, Lab Results, Assessment/Plan   Last Updated: 17-Jul-15 13:11 by GDallas Schimke(MD)

## 2014-11-24 ENCOUNTER — Encounter: Payer: Self-pay | Admitting: *Deleted

## 2014-11-30 ENCOUNTER — Ambulatory Visit: Payer: Self-pay | Admitting: Radiation Oncology

## 2014-12-19 ENCOUNTER — Ambulatory Visit: Payer: Medicare PPO | Attending: Radiation Oncology | Admitting: Radiation Oncology

## 2015-01-19 DIAGNOSIS — I81 Portal vein thrombosis: Secondary | ICD-10-CM

## 2015-01-19 HISTORY — DX: Portal vein thrombosis: I81

## 2015-04-04 DIAGNOSIS — M7989 Other specified soft tissue disorders: Secondary | ICD-10-CM | POA: Insufficient documentation

## 2015-04-04 HISTORY — DX: Other specified soft tissue disorders: M79.89

## 2015-06-10 ENCOUNTER — Other Ambulatory Visit: Payer: Self-pay | Admitting: Pain Medicine

## 2015-06-14 ENCOUNTER — Encounter: Payer: Self-pay | Admitting: Pain Medicine

## 2015-06-14 ENCOUNTER — Other Ambulatory Visit
Admission: RE | Admit: 2015-06-14 | Discharge: 2015-06-14 | Disposition: A | Discharge status: Home / Self Care | Payer: Medicare PPO | Source: Ambulatory Visit | Attending: Pain Medicine | Admitting: Pain Medicine

## 2015-06-14 ENCOUNTER — Other Ambulatory Visit: Payer: Self-pay | Admitting: Pain Medicine

## 2015-06-14 ENCOUNTER — Ambulatory Visit: Payer: Medicare PPO | Attending: Pain Medicine | Admitting: Pain Medicine

## 2015-06-14 VITALS — BP 114/72 | HR 66 | Temp 98.1°F | Resp 18 | Ht 66.0 in | Wt 195.0 lb

## 2015-06-14 DIAGNOSIS — M545 Low back pain: Secondary | ICD-10-CM | POA: Diagnosis not present

## 2015-06-14 DIAGNOSIS — M549 Dorsalgia, unspecified: Secondary | ICD-10-CM | POA: Diagnosis present

## 2015-06-14 DIAGNOSIS — F418 Other specified anxiety disorders: Secondary | ICD-10-CM | POA: Diagnosis not present

## 2015-06-14 DIAGNOSIS — Z87891 Personal history of nicotine dependence: Secondary | ICD-10-CM | POA: Diagnosis not present

## 2015-06-14 DIAGNOSIS — M12859 Other specific arthropathies, not elsewhere classified, unspecified hip: Secondary | ICD-10-CM | POA: Insufficient documentation

## 2015-06-14 DIAGNOSIS — G893 Neoplasm related pain (acute) (chronic): Secondary | ICD-10-CM | POA: Insufficient documentation

## 2015-06-14 DIAGNOSIS — G8929 Other chronic pain: Secondary | ICD-10-CM | POA: Insufficient documentation

## 2015-06-14 DIAGNOSIS — M5136 Other intervertebral disc degeneration, lumbar region: Secondary | ICD-10-CM | POA: Insufficient documentation

## 2015-06-14 DIAGNOSIS — Z8542 Personal history of malignant neoplasm of other parts of uterus: Secondary | ICD-10-CM

## 2015-06-14 DIAGNOSIS — F1190 Opioid use, unspecified, uncomplicated: Secondary | ICD-10-CM | POA: Diagnosis not present

## 2015-06-14 DIAGNOSIS — C541 Malignant neoplasm of endometrium: Secondary | ICD-10-CM | POA: Insufficient documentation

## 2015-06-14 DIAGNOSIS — R109 Unspecified abdominal pain: Secondary | ICD-10-CM | POA: Insufficient documentation

## 2015-06-14 DIAGNOSIS — Z5181 Encounter for therapeutic drug level monitoring: Secondary | ICD-10-CM | POA: Insufficient documentation

## 2015-06-14 DIAGNOSIS — M129 Arthropathy, unspecified: Secondary | ICD-10-CM

## 2015-06-14 DIAGNOSIS — M542 Cervicalgia: Secondary | ICD-10-CM | POA: Diagnosis not present

## 2015-06-14 DIAGNOSIS — M25559 Pain in unspecified hip: Secondary | ICD-10-CM

## 2015-06-14 DIAGNOSIS — I10 Essential (primary) hypertension: Secondary | ICD-10-CM | POA: Diagnosis not present

## 2015-06-14 DIAGNOSIS — C801 Malignant (primary) neoplasm, unspecified: Secondary | ICD-10-CM | POA: Insufficient documentation

## 2015-06-14 DIAGNOSIS — N189 Chronic kidney disease, unspecified: Secondary | ICD-10-CM | POA: Insufficient documentation

## 2015-06-14 DIAGNOSIS — B182 Chronic viral hepatitis C: Secondary | ICD-10-CM | POA: Diagnosis not present

## 2015-06-14 DIAGNOSIS — Z79891 Long term (current) use of opiate analgesic: Secondary | ICD-10-CM

## 2015-06-14 DIAGNOSIS — Z79899 Other long term (current) drug therapy: Secondary | ICD-10-CM | POA: Diagnosis not present

## 2015-06-14 DIAGNOSIS — M5410 Radiculopathy, site unspecified: Secondary | ICD-10-CM

## 2015-06-14 DIAGNOSIS — M797 Fibromyalgia: Secondary | ICD-10-CM | POA: Insufficient documentation

## 2015-06-14 DIAGNOSIS — M79606 Pain in leg, unspecified: Secondary | ICD-10-CM | POA: Insufficient documentation

## 2015-06-14 DIAGNOSIS — M1610 Unilateral primary osteoarthritis, unspecified hip: Secondary | ICD-10-CM | POA: Insufficient documentation

## 2015-06-14 HISTORY — DX: Pain in leg, unspecified: M79.606

## 2015-06-14 LAB — COMPREHENSIVE METABOLIC PANEL
ALT: 18 U/L (ref 14–54)
AST: 24 U/L (ref 15–41)
Albumin: 3.7 g/dL (ref 3.5–5.0)
Alkaline Phosphatase: 45 U/L (ref 38–126)
Anion gap: 8 (ref 5–15)
BUN: 17 mg/dL (ref 6–20)
CHLORIDE: 109 mmol/L (ref 101–111)
CO2: 23 mmol/L (ref 22–32)
CREATININE: 0.88 mg/dL (ref 0.44–1.00)
Calcium: 8.7 mg/dL — ABNORMAL LOW (ref 8.9–10.3)
GFR calc Af Amer: >60 mL/min (ref >60)
GFR calc non Af Amer: >60 mL/min (ref >60)
Glucose, Bld: 149 mg/dL — ABNORMAL HIGH (ref 65–99)
Potassium: 3.3 mmol/L — ABNORMAL LOW (ref 3.5–5.1)
SODIUM: 140 mmol/L (ref 135–145)
Total Bilirubin: 0.8 mg/dL (ref 0.3–1.2)
Total Protein: 7 g/dL (ref 6.5–8.1)

## 2015-06-14 LAB — SEDIMENTATION RATE: SED RATE: 39 mm/h — AB (ref 0–30)

## 2015-06-14 LAB — C-REACTIVE PROTEIN

## 2015-06-14 LAB — MAGNESIUM: Magnesium: 2.1 mg/dL (ref 1.7–2.4)

## 2015-06-14 LAB — VITAMIN B12: Vitamin B-12: 280 pg/mL (ref 180–914)

## 2015-06-14 MED ORDER — OXYCODONE HCL 5 MG PO TABS
5.0000 mg | ORAL_TABLET | ORAL | Status: DC | PRN
Start: 2015-06-14 — End: 2015-09-06

## 2015-06-14 MED ORDER — OXYCODONE HCL 5 MG PO TABS: 5.0000 mg | ORAL_TABLET | Freq: Four times a day (QID) | ORAL | Status: DC | PRN

## 2015-06-14 NOTE — Progress Notes (Signed)
Patient's Name: Elizabeth Monroe MRN: [id number] DOB: Oct 22, 1950 DOS: 06/14/2015  Primary Reason(s) for Visit: Encounter for Medication Management CC: Back Pain and Abdominal Pain   HPI:    Ms. Griffon is a 65 y.o. year old, female patient, who returns today as an established patient. She has Neurosis, posttraumatic; Malignant melanoma ([place]); BP (high blood pressure); HCV (hepatitis C virus); Chronic viral hepatitis C ([place]); Degeneration of intervertebral disc of lumbar region; Chronic kidney disease; Chronic pain; Long term current use of opiate analgesic; Long term prescription opiate use; Opiate use; Encounter for therapeutic drug level monitoring; Cancer-related pain; History of metastatic endometrial cancer (metastases to lymph nodes); Chronic low back pain; Chronic neck pain; Chronic hip pain; Endometrial cancer ([place]); Chronic pain of lower extremity; Chronic radicular pain of lower extremity; Arthropathy of hip; and History of uterine cancer on her problem list.. Her primarily concern today is the Back Pain and Abdominal Pain     The patient comes into the [facility] pain clinic for the first time today for pharmacological management of her chronic pain. She indicates that she is taking 3-4 tablets per day which explains why it has lasted so long. Her last refill had been on 04/25/2015 according to the [facility] PMP. She indicates having no problems with the medications.  Today's Pain Score: 4 , clinically she looks like a 1-2/10. Reported level of pain is incompatible with clinical obrservations. This may be secondary to a possible lack of understanding on how the pain scale works. Pain Type: Chronic pain Pain Location: Back Pain Orientation: Lower Pain Descriptors / Indicators: Aching Pain Frequency: Intermittent  Date of Last Visit: 01/30/15 Service Provided on Last Visit: Med Refill  Pharmacotherapy Review:   Side-effects or Adverse reactions: None  reported Effectiveness: Described as relatively effective, allowing for increase in activities of daily living (ADL) Onset of action: Within expected pharmacological parameters Duration of action: Within normal limits for medication Peak effect: Timing and results are as within normal expected parameters [place] PMP: Compliant with practice rules and regulations UDS Results: No UDS available, at this time UDS Interpretation: No UDS available, at this time Medication Assessment Form: Reviewed. Patient indicates being compliant with therapy Treatment compliance: Compliant Substance Use Disorder (SUD) Risk Level: Low Pharmacologic Plan: Continue therapy as is  Lab Work: Inflammation Markers No results found for: ESRSEDRATE, CRP  Renal Function Lab Results  Component Value Date   BUN 18 12/24/2013   CREATININE 1.00 12/24/2013   GFRAA >60 12/24/2013   GFRNONAA >60 12/24/2013    Hepatic Function Lab Results  Component Value Date   AST 36 12/23/2013   ALT 36 12/23/2013   ALBUMIN 2.5* 12/23/2013    Electrolytes Lab Results  Component Value Date   NA 133* 12/24/2013   K 3.6 12/26/2013   CL 107 12/24/2013   CALCIUM 7.4* [id number]    Illicit Drugs No results found for: THCU, COCAINSCRNUR, PCPSCRNUR, MDMA, AMPHETMU, METHADONE, ETOH  Allergies:  Ms. Breuninger has No Known Allergies.  Meds:  The patient has a current medication list which includes the following prescription(s): aspirin ec, furosemide, lactulose, losartan, megestrol, melatonin, multi-vitamins, omeprazole, oxycodone, pantoprazole, promethazine, propranolol, psyllium, spironolactone, tamoxifen, oxycodone, and oxycodone. Requested Prescriptions   Signed Prescriptions Disp Refills  . oxyCODONE (OXY IR/ROXICODONE) 5 MG immediate release tablet 120 tablet 0    Sig: Take 1 tablet (5 mg total) by mouth every 6 (six) hours as needed for moderate pain or severe pain.  Marland Kitchen oxyCODONE (OXY IR/ROXICODONE)  5 MG immediate release  tablet 120 tablet 0    Sig: Take 1 tablet (5 mg total) by mouth every 6 (six) hours as needed for moderate pain or severe pain.  Marland Kitchen oxyCODONE (OXY IR/ROXICODONE) 5 MG immediate release tablet 120 tablet 0    Sig: Take 1 tablet (5 mg total) by mouth every 4 (four) hours as needed for moderate pain or severe pain.    ROS:  Constitutional: Afebrile, no chills, well hydrated and well nourished Gastrointestinal: negative Musculoskeletal:negative Neurological: negative Behavioral/Psych: negative  PFSH:  Medical:  Ms. 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 Family: family history includes Cancer in her mother; Heart disease in her father; Hypertension in her father. Surgical:  has past surgical history that includes Abdominal hysterectomy; Brain surgery; Splenectomy, total; and Melanoma excision. Tobacco:  reports that she has quit smoking. She does not have any smokeless tobacco history on file. Alcohol:  reports that she does not drink alcohol. Drug:  reports that she does not use illicit drugs.  Physical Exam:  Vitals:  Today's Vitals   06/14/15 0816 06/14/15 0818  BP: 114/72   Pulse: 66   Temp: 98.1 F (36.7 C)   Resp: 18   Height: 5\' 6"  (1.676 m)   Weight: 195 lb (88.451 kg)   SpO2: 100%   PainSc: 4  4   PainLoc: Back   Calculated BMI: Body mass index is 31.49 kg/(m^2). General appearance: alert, cooperative, appears stated age and no distress Eyes: PERLA Respiratory: No evidence respiratory distress, no audible rales or ronchi and no use of accessory muscles of respiration Neck: no adenopathy, no carotid bruit, no JVD, supple, symmetrical, trachea midline and thyroid not enlarged, symmetric,  no tenderness/mass/nodules  Cervical Spine ROM: Adequate for flexion, extension, rotation, and lateral bending Palpation: No palpable trigger points  Upper Extremities ROM: Adequate bilaterally Strength: 5/5 for all flexors and extensors of the upper extremity, bilaterally Pulses: Palpable bilaterally Neurologic: No allodynia, No hyperesthesia, No hyperpathia and No sensory abnormalities  Lumbar Spine ROM: Adequate for flexion, extension, rotation, and lateral bending Palpation: No palpable trigger points Lumbar Hyperextension and rotation: Non-contributory Patrick's Maneuver: Non-contributory  Lower Extremities ROM: Adequate bilaterally Strength: 5/5 for all flexors and extensors of the lower extremity, bilaterally Pulses: Palpable bilaterally Neurologic: No allodynia, No hyperesthesia, No hyperpathia, No sensory abnormalities and No antalgic gait or posture  Assessment:  Encounter Diagnosis:  Primary Diagnosis: Chronic pain [G89.29]  Plan:   Interventional Therapies: None at this time.    Alejandrina was seen today for back pain and abdominal pain.  Diagnoses and all orders for this visit:  Chronic pain -     oxyCODONE (OXY IR/ROXICODONE) 5 MG immediate release tablet; Take 1 tablet (5 mg total) by mouth every 6 (six) hours as needed for moderate pain or severe pain. -     oxyCODONE (OXY IR/ROXICODONE) 5 MG immediate release tablet; Take 1 tablet (5 mg total) by mouth every 6 (six) hours as needed for moderate pain or severe pain. -     oxyCODONE (OXY IR/ROXICODONE) 5 MG immediate release tablet; Take 1 tablet (5 mg total) by mouth every 4 (four) hours as needed for moderate pain or severe pain. -     COMPLETE METABOLIC PANEL WITH GFR -     C-reactive protein -  Magnesium -     Sedimentation rate -     Vitamin B12 -     Vitamin D pnl(25-hydrxy+1,25-dihy)-bld  Long term current use of opiate analgesic -     Drugs of abuse screen w/o alc, rtn urine-sln  Long term  prescription opiate use  Opiate use  Encounter for therapeutic drug level monitoring  Cancer-related pain  History of metastatic endometrial cancer (metastases to lymph nodes)  Chronic low back pain  Chronic neck pain  Chronic hip pain, unspecified laterality  Endometrial cancer (HCC)  Chronic pain of lower extremity, unspecified laterality  Chronic radicular pain of lower extremity  Arthropathy of hip  History of uterine cancer     Patient Instructions  Instructed to get labwork within 2 weeks in the medical mall.  Patient states she knows where it is.  Medications discontinued today:  Medications Discontinued During This Encounter  Medication Reason  . megestrol (MEGACE) 40 MG tablet   . omeprazole (PRILOSEC) 20 MG capsule   . oxycodone (OXY-IR) 5 MG capsule   . tamoxifen (NOLVADEX) 20 MG tablet   . oxyCODONE (OXY IR/ROXICODONE) 5 MG immediate release tablet Reorder  . sucralfate (CARAFATE) 1 g tablet Error   Medications administered today:  Ms. Griffus had no medications administered during this visit.  Primary Care Physician: Sharyne Peach, MD Location: [facility] Note by: Kathlen Brunswick Dossie Arbour, M.D, DABA, DABAPM, DABPM, DABIPP, FIPP

## 2015-06-14 NOTE — Patient Instructions (Signed)
Instructed to get labwork within 2 weeks in the medical mall.  Patient states she knows where it is.

## 2015-06-14 NOTE — Progress Notes (Signed)
Safety precautions to be maintained throughout the outpatient stay will include: orient to surroundings, keep bed in low position, maintain call bell within reach at all times, provide assistance with transfer out of bed and ambulation. Did not bring pills for pill count.  Reminded to bring to every appointment 

## 2015-06-15 LAB — VITAMIN D 25 HYDROXY (VIT D DEFICIENCY, FRACTURES): Vit D, 25-Hydroxy: 16.7 ng/mL — ABNORMAL LOW (ref 30.0–100.0)

## 2015-06-19 LAB — TOXASSURE SELECT 13 (MW), URINE: PDF: 0

## 2015-07-03 ENCOUNTER — Other Ambulatory Visit: Payer: Self-pay | Admitting: Pain Medicine

## 2015-07-03 ENCOUNTER — Encounter: Payer: Self-pay | Admitting: Pain Medicine

## 2015-07-03 DIAGNOSIS — E559 Vitamin D deficiency, unspecified: Secondary | ICD-10-CM

## 2015-07-03 NOTE — Progress Notes (Signed)
Quick Note:   A normal sedimentation rate should be below 30 mm/hr. The sed rate is an acute phase reactant that indirectly measures the degree of inflammation present in the body. It can be acute, developing rapidly after trauma, injury or infection, for example, or can occur over an extended time (chronic) with conditions such as autoimmune diseases or cancer. The ESR is not diagnostic; it is a non-specific, screening test that may be elevated in a number of these different conditions. It provides general information about the presence or absence of an inflammatory condition.  ______ 

## 2015-07-03 NOTE — Progress Notes (Signed)
Quick Note:  Potassium levels below 3.6 mmol/L are considered to be low. Levels (less than 2.5 mmol/L) can be life-threatening and requires urgent medical attention. Low potassium (hypokalemia) has many causes. The most common cause is excessive potassium loss in urine due to prescription water or fluid pills (diuretics). Vomiting or diarrhea or both can result in excessive potassium loss from the digestive tract. Causes of potassium loss leading to low potassium include: chronic kidney disease; diabetic ketoacidosis; diarrhea; excessive alcohol use; excessive laxative use; excessive sweating; folic acid deficiency; diuretics; primary aldosteronism; vomiting; and/or some antibiotic use. ______

## 2015-07-03 NOTE — Progress Notes (Signed)

## 2015-09-06 ENCOUNTER — Ambulatory Visit: Payer: Medicare PPO | Attending: Pain Medicine | Admitting: Pain Medicine

## 2015-09-06 ENCOUNTER — Encounter: Payer: Self-pay | Admitting: Pain Medicine

## 2015-09-06 VITALS — BP 104/63 | HR 64 | Temp 98.5°F | Resp 16 | Ht 64.0 in | Wt 193.0 lb

## 2015-09-06 DIAGNOSIS — F1190 Opioid use, unspecified, uncomplicated: Secondary | ICD-10-CM

## 2015-09-06 DIAGNOSIS — M542 Cervicalgia: Secondary | ICD-10-CM | POA: Diagnosis not present

## 2015-09-06 DIAGNOSIS — M797 Fibromyalgia: Secondary | ICD-10-CM | POA: Insufficient documentation

## 2015-09-06 DIAGNOSIS — Z5181 Encounter for therapeutic drug level monitoring: Secondary | ICD-10-CM

## 2015-09-06 DIAGNOSIS — Z8542 Personal history of malignant neoplasm of other parts of uterus: Secondary | ICD-10-CM

## 2015-09-06 DIAGNOSIS — M545 Low back pain: Secondary | ICD-10-CM | POA: Diagnosis not present

## 2015-09-06 DIAGNOSIS — M549 Dorsalgia, unspecified: Secondary | ICD-10-CM | POA: Insufficient documentation

## 2015-09-06 DIAGNOSIS — M25559 Pain in unspecified hip: Secondary | ICD-10-CM | POA: Diagnosis present

## 2015-09-06 DIAGNOSIS — Z86718 Personal history of other venous thrombosis and embolism: Secondary | ICD-10-CM | POA: Diagnosis not present

## 2015-09-06 DIAGNOSIS — Z79891 Long term (current) use of opiate analgesic: Secondary | ICD-10-CM

## 2015-09-06 DIAGNOSIS — G8929 Other chronic pain: Secondary | ICD-10-CM | POA: Insufficient documentation

## 2015-09-06 DIAGNOSIS — C541 Malignant neoplasm of endometrium: Secondary | ICD-10-CM

## 2015-09-06 DIAGNOSIS — E559 Vitamin D deficiency, unspecified: Secondary | ICD-10-CM | POA: Insufficient documentation

## 2015-09-06 DIAGNOSIS — I10 Essential (primary) hypertension: Secondary | ICD-10-CM | POA: Insufficient documentation

## 2015-09-06 DIAGNOSIS — Z90710 Acquired absence of both cervix and uterus: Secondary | ICD-10-CM | POA: Diagnosis not present

## 2015-09-06 DIAGNOSIS — G893 Neoplasm related pain (acute) (chronic): Secondary | ICD-10-CM | POA: Diagnosis not present

## 2015-09-06 DIAGNOSIS — C779 Secondary and unspecified malignant neoplasm of lymph node, unspecified: Secondary | ICD-10-CM | POA: Insufficient documentation

## 2015-09-06 DIAGNOSIS — Z85820 Personal history of malignant melanoma of skin: Secondary | ICD-10-CM | POA: Diagnosis not present

## 2015-09-06 DIAGNOSIS — Z87891 Personal history of nicotine dependence: Secondary | ICD-10-CM | POA: Diagnosis not present

## 2015-09-06 DIAGNOSIS — F331 Major depressive disorder, recurrent, moderate: Secondary | ICD-10-CM | POA: Insufficient documentation

## 2015-09-06 DIAGNOSIS — K7460 Unspecified cirrhosis of liver: Secondary | ICD-10-CM

## 2015-09-06 DIAGNOSIS — C7951 Secondary malignant neoplasm of bone: Secondary | ICD-10-CM

## 2015-09-06 MED ORDER — MORPHINE SULFATE ER 15 MG PO TBCR: 15.0000 mg | EXTENDED_RELEASE_TABLET | Freq: Two times a day (BID) | ORAL | Status: DC

## 2015-09-06 MED ORDER — VITAMIN D3 50 MCG (2000 UT) PO CAPS: ORAL_CAPSULE | ORAL | Status: AC

## 2015-09-06 MED ORDER — NALOXONE HCL 4 MG/0.1ML NA LIQD: 1.0000 | Freq: Once | NASAL | Status: AC

## 2015-09-06 MED ORDER — OXYCODONE HCL 5 MG PO TABS: 5.0000 mg | ORAL_TABLET | ORAL | Status: DC | PRN

## 2015-09-06 MED ORDER — OXYCODONE HCL 5 MG PO TABS: 5.0000 mg | ORAL_TABLET | Freq: Four times a day (QID) | ORAL | Status: DC | PRN

## 2015-09-06 MED ORDER — VITAMIN D (ERGOCALCIFEROL) 1.25 MG (50000 UNIT) PO CAPS: ORAL_CAPSULE | ORAL | Status: DC

## 2015-09-06 NOTE — Progress Notes (Signed)
Safety precautions to be maintained throughout the outpatient stay will include: orient to surroundings, keep bed in low position, maintain call bell within reach at all times, provide assistance with transfer out of bed and ambulation.  

## 2015-09-06 NOTE — Progress Notes (Signed)
Did not bring medication bottles to the appt. Patient states her doctor at [facility] added Morphine, but she does not know the dosage.

## 2015-09-06 NOTE — Patient Instructions (Signed)
Naloxone nasal spray What is this medicine? NALOXONE (nal OX one) is a narcotic blocker. It is used to treat narcotic drug overdose. This medicine may be used for other purposes; ask your health care provider or pharmacist if you have questions. What should I tell my health care provider before I take this medicine? They need to know if you have any of these conditions: -drug abuse or addiction -heart disease -an unusual or allergic reaction to naloxone, other medicines, foods, dyes, or preservatives -pregnant or trying to get pregnant -breast-feeding How should I use this medicine? This medicine is for use in the nose. Lay the person on their back. Support their neck with your hand and allow the head to tilt back before giving the medicine. The nasal spray should be given into 1 nostril. After giving the medicine, move the person onto their side. Do not remove or test the nasal spray until ready to use. Get emergency medical help right away after giving the first dose of this medicine, even if the person wakes up. You should be familiar with how to recognize the signs and symptoms of a narcotic overdose. If more doses are needed, give the additional dose in the other nostril. Talk to your pediatrician regarding the use of this medicine in children. While this drug may be prescribed for children as young as newborns for selected conditions, precautions do apply. Overdosage: If you think you have taken too much of this medicine contact a poison control center or emergency room at once. NOTE: This medicine is only for you. Do not share this medicine with others. What if I miss a dose? This does not apply. What may interact with this medicine? This medicine is only used during an emergency. No interactions are expected during emergency use. This list may not describe all possible interactions. Give your health care provider a list of all the medicines, herbs, non-prescription drugs, or dietary  supplements you use. Also tell them if you smoke, drink alcohol, or use illegal drugs. Some items may interact with your medicine. What should I watch for while using this medicine? Keep this medicine ready for use in the case of a narcotic overdose. Make sure that you have the phone number of your doctor or health care professional and local hospital ready. You may need to have additional doses of this medicine. Each nasal spray contains a single dose. Some emergencies may require additional doses. After use, bring the treated person to the nearest hospital or call 911. Make sure the treating health care professional knows that the person has received an injection of this medicine. You will receive additional instructions on what to do during and after use of this medicine before an emergency occurs. What side effects may I notice from receiving this medicine? Side effects that you should report to your doctor or health care professional as soon as possible: -allergic reactions like skin rash, itching or hives, swelling of the face, lips, or tongue -breathing problems -fast, irregular heartbeat -high blood pressure -pain that was controlled by narcotic pain medicine -seizures Side effects that usually do not require medical attention (report these to your doctor or health care professional if they continue or are bothersome): -anxious -chills -diarrhea -fever -headache -muscle pain -nausea, vomiting -nose irritation like dryness, congestion, and swelling -sweating This list may not describe all possible side effects. Call your doctor for medical advice about side effects. You may report side effects to FDA at 1-800-FDA-1088. Where should I keep my   medicine? Keep out of the reach of children. Store between 4 and 40 degrees C (39 and 104 degrees F). Do not freeze. Throw away any unused medicine after the expiration date. Keep in original box until ready to use. NOTE: This sheet is a summary.  It may not cover all possible information. If you have questions about this medicine, talk to your doctor, pharmacist, or health care provider.    2016, Elsevier/Gold Standard. (2014-05-13 14:24:03)  

## 2015-09-06 NOTE — Progress Notes (Signed)
Patient's Name: NEHEMIAH ZADRA MRN: [id number] DOB: 1950-07-26 DOS: 09/06/2015  Primary Reason(s) for Visit: Encounter for Medication Management CC: Hip Pain; Back Pain; and Joint Pain   HPI  Ms. Kalama is a 65 y.o. year old, female patient, who returns today as an established patient. 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 Her primarily concern today is the Hip Pain; Back Pain; and Joint Pain   The patient returns to the clinics today for pharmacological management of her chronic pain. The patient has been managed by [facility] oncology for her metastatic cancer. They recently increase her pain medication by adding MS Contin 15 mg every 12 hours and also given her a prescription for oxycodone IR 5 mg to be taken 1-2 tablets every 4 hours when necessary for pain. The patient tells today that she would like for [place] to take over her pain medication management since she understood her oncology just to say that her cancer was terminal and there wasn't anything else that they can do except for managing the pain. She says that it is more convenient for her to come here and therefore she would like for [place] to take care of that. Unfortunately, the  patient was unable to tell me how many oxycodone pills she exactly takes per day and therefore I have given her enough medication to last for only one month and I have instructed her to always bring me all of her prescriptions and also to keep track of how many pills she is using per day.  Pain Assessment: Self-Reported Pain Score: 3  Reported level is compatible with observation Pain Type: Chronic pain Pain Descriptors / Indicators: Aching Pain Frequency: Intermittent  Date of Last Visit: 06/14/15 Service Provided on Last Visit: Med Refill  Controlled Substance Pharmacotherapy Assessment  Analgesic: Morphine ER 15 mg 1 twice a day (30 mg/day) + oxycodone IR 5 mg 1-2 tablets every 4-6 hours (6-8 tablets per day) (40 mg/day) Pill Count: Did not bring medication bottles to the appt. Patient states her doctor at [facility] added Morphine, but she does not know the dosage. Last oxycodone prescription filled on 08/21/2015. Last morphine ER prescription filled on 08/14/2015. MME/day: 90 mg/day Pharmacokinetics: Onset of action (Liberation/Absorption): Within expected pharmacological parameters Time to Peak effect (Distribution): Timing and results are as within normal expected parameters Duration of action (Metabolism/Excretion): Within normal limits for medication Pharmacodynamics: Analgesic Effect: More than 50% Activity Facilitation: Medication(s) allow patient to sit, stand, walk, and do the basic ADLs Perceived Effectiveness: Described as relatively effective, allowing for increase in activities of daily living (ADL) Side-effects or Adverse reactions: None reported Monitoring: [place] PMP: Online review of the past 38-month period conducted. Compliant with practice rules and regulations UDS Results/interpretation: The patient's last UDS was done on 06/14/2015 and it came back within normal limits with no unexpected results. Medication Assessment Form: Reviewed. Patient indicates  being compliant with  therapy Treatment compliance: Compliant Risk Assessment: Aberrant Behavior: None observed today Substance Use Disorder (SUD) Risk Level: Low Opioid Risk Tool (ORT) Score: Total Score: 5 Moderate Risk for SUD (Score between 4-7) Depression Scale Score: PHQ-2: PHQ-2 Total Score: 0 No depression (0) PHQ-9: PHQ-9 Total Score: 0 No depression (0-4)  Pharmacologic Plan: No change in therapy, at this time   Laboratory Workup  Last ED UDS: No results found for: THCU, COCAINSCRNUR, PCPSCRNUR, MDMA, AMPHETMU, METHADONE, ETOH  Inflammation Markers Lab Results  Component Value Date   ESRSEDRATE 39* 06/14/2015   CRP <0.5 06/14/2015    Renal Function Lab Results  Component Value Date   BUN 17 06/14/2015   CREATININE 0.88 06/14/2015   GFRAA >60 06/14/2015   GFRNONAA >60 06/14/2015    Hepatic Function Lab Results  Component Value Date   AST 24 06/14/2015   ALT 18 06/14/2015   ALBUMIN 3.7 06/14/2015    Electrolytes Lab Results  Component Value Date   NA 140 06/14/2015   K 3.3* 06/14/2015   CL 109 06/14/2015   CALCIUM 8.7* 06/14/2015   MG 2.1 06/14/2015    Allergies  Ms. Nooner has No Known Allergies.  Meds  The patient has a current medication list which includes the following prescription(s): alcohol prep, anastrozole, aspirin ec, furosemide, lactulose, losartan, melatonin, morphine, omeprazole, ondansetron, oxycodone, pantoprazole, promethazine, propranolol, psyllium, spironolactone, vitamin d3, naloxone hcl, and vitamin d (ergocalciferol).  Current Outpatient Prescriptions on File Prior to Visit  Medication Sig  . aspirin EC 81 MG tablet Take 81 mg by mouth daily.   . furosemide (LASIX) 40 MG tablet Take 40 mg by mouth daily.  Marland Kitchen lactulose (CHRONULAC) 10 GM/15ML solution Take by mouth every 4 (four) hours.  Marland Kitchen losartan (COZAAR) 25 MG tablet Take 25 mg by mouth daily.  . Melatonin 3 MG TABS Take by mouth. Reported on 09/06/2015  . omeprazole (PRILOSEC) 40 MG capsule  TAKE 1 CAPSULE (40 MG TOTAL) BY MOUTH ONCE DAILY.  . pantoprazole (PROTONIX) 40 MG tablet Take 40 mg by mouth daily.   . promethazine (PHENERGAN) 25 MG tablet Take 25 mg by mouth every 6 (six) hours as needed for nausea or vomiting.  . propranolol (INDERAL) 10 MG tablet Take 10 mg by mouth 2 (two) times daily.  . psyllium (METAMUCIL) 58.6 % packet Take 1 packet by mouth daily.   No current facility-administered medications on file prior to visit.    ROS  Constitutional: Afebrile, no chills, well hydrated and well nourished Gastrointestinal: negative Musculoskeletal:negative Neurological: negative Behavioral/Psych: negative  PFSH  Medical:  Ms. 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 Family: family history includes Cancer in her mother; Heart disease in her father; Hypertension in her father. Surgical:  has past surgical history that includes Abdominal hysterectomy; Brain surgery; Splenectomy, total; and Melanoma excision. Tobacco:  reports that she has quit smoking. She does not have any smokeless tobacco history on file. Alcohol:  reports that she does not drink alcohol. Drug:  reports that she does not use illicit drugs.  Physical Exam  Vitals:  Today's Vitals   09/06/15 1451 09/06/15 1454  BP: 104/63   Pulse: 64   Temp: 98.5 F (36.9 C)   TempSrc: Oral   Resp: 16   Height: 5\' 4"  (1.626  m)   Weight: 193 lb (87.544 kg)   SpO2: 100%   PainSc:  3     Calculated BMI: Body mass index is 33.11 kg/(m^2). Obese (Class I) (30-34.9 kg/m2) - 68% higher incidence of chronic pain  General appearance: alert, cooperative, appears stated age, mild distress and morbidly obese Eyes:  PERLA Respiratory: No evidence respiratory distress, no audible rales or ronchi and no use of accessory muscles of respiration  Cervical Spine Inspection: Normal anatomy Alignment: Symetrical ROM: Adequate  Upper Extremities Inspection: No gross anomalies detected ROM: Adequate Sensory: Normal Motor: Unremarkable  Thoracic Spine Inspection: No gross anomalies detected Alignment: Symetrical ROM: Adequate  Lumbar Spine Inspection: No gross anomalies detected Alignment: Symetrical ROM: Decreased  Gait: Antalgic (limping)  Lower Extremities Inspection: No gross anomalies detected ROM: Adequate Sensory:  Normal Motor: Unremarkable  Assessment & Plan  Primary Diagnosis & Pertinent Problem List: The primary encounter diagnosis was Cancer-related pain. 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  Visit Diagnosis: 1. Cancer-related pain   2. Chronic pain   3. Encounter for therapeutic drug level monitoring   4. Long term current use of opiate analgesic   5. Vitamin D deficiency   6. Chronic low back pain   7. Endometrial cancer ([place])   8. 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 Opiate use     Problem-specific Plan(s): No problem-specific assessment & plan notes found for this encounter.   Plan of Care  Pharmacotherapy (Medications Ordered): Meds ordered this encounter  Medications  . DISCONTD: oxyCODONE (OXY IR/ROXICODONE) 5 MG immediate release tablet    Sig: Take 1 tablet (5 mg total) by mouth every 6 (six) hours as needed for moderate pain or severe pain.    Dispense:  120 tablet    Refill:  0    Do not place this medication, or any other prescription from our  practice, on "Automatic Refill". Patient may have prescription filled one day early if pharmacy is closed on scheduled refill date. Do not fill until: 09/09/15 To last until: 10/09/15  . DISCONTD: oxyCODONE (OXY IR/ROXICODONE) 5 MG immediate release tablet    Sig: Take 1 tablet (5 mg total) by mouth every 6 (six) hours as needed for moderate pain or severe pain.    Dispense:  120 tablet    Refill:  0    Do not place this medication, or any other prescription from our practice, on "Automatic Refill". Patient may have prescription filled one day early if pharmacy is closed on scheduled refill date. Do not fill until: 10/09/15 To last until: 11/08/15  . DISCONTD: oxyCODONE (OXY IR/ROXICODONE) 5 MG immediate release tablet    Sig: Take 1 tablet (5 mg total) by mouth every 4 (four) hours as needed for moderate pain or severe pain.    Dispense:  120 tablet    Refill:  0    Do not place this medication, or any other prescription from our practice, on "Automatic Refill". Patient may have prescription filled one day early if pharmacy is closed on scheduled refill date. Do not fill until: 11/08/15 To last until: 12/08/15  . Vitamin D, Ergocalciferol, (DRISDOL) 50000 units CAPS capsule    Sig: Take 1 capsule (50,000 Units total) by mouth 2 (two)  times a week. X 6 weeks.    Dispense:  12 capsule    Refill:  0    Do not place this medication, or any other prescription from our practice, on "Automatic Refill".  . Cholecalciferol (VITAMIN D3) 2000 units capsule    Sig: Take 1 capsule (2,000 Units total) by mouth daily.    Dispense:  30 capsule    Refill:  PRN    Do not place this medication, or any other prescription from our practice, on "Automatic Refill".  . DISCONTD: morphine (MS CONTIN) 15 MG 12 hr tablet    Sig: Take 1 tablet (15 mg total) by mouth every 12 (twelve) hours.    Dispense:  60 tablet    Refill:  0    Do not add this medication to the electronic "Automatic Refill" notification  system. Patient may have prescription filled one day early if pharmacy is closed on scheduled refill date. Do not fill until: 09/13/15 To last until: 10/13/15  . oxyCODONE (OXY IR/ROXICODONE) 5 MG immediate release tablet    Sig: Take 1-2 tablets (5-10 mg total) by mouth every 6 (six) hours as needed for severe pain or breakthrough pain.    Dispense:  240 tablet    Refill:  0    Do not place this medication, or any other prescription from our practice, on "Automatic Refill". Patient may have prescription filled one day early if pharmacy is closed on scheduled refill date. Do not fill until: 09/09/15 To last until: 10/09/15  . morphine (MS CONTIN) 15 MG 12 hr tablet    Sig: Take 1 tablet (15 mg total) by mouth every 12 (twelve) hours.    Dispense:  60 tablet    Refill:  0    Do not add this medication to the electronic "Automatic Refill" notification system. Patient may have prescription filled one day early if pharmacy is closed on scheduled refill date. Do not fill until: 09/09/15 To last until: 10/09/15  . Naloxone HCl (NARCAN) 4 MG/0.1ML LIQD    Sig: Place 1 Bottle into the nose once. , then call 911, repeat if needed in other nostril with new bottle.    Dispense:  2 each    Refill:  0    Please provide the patient with clear instructions on the use of this device/medication.    Lab-work & Procedure Ordered: Orders Placed This Encounter  Procedures  . ToxASSURE Select 13 (MW), Urine    Volume: 30 ml(s). Minimum 3 ml of urine is needed. Document temperature of fresh sample. Indications: Long term (current) use of opiate analgesic [id number]    Imaging Ordered: None  Interventional Therapies: Scheduled: None at this time. PRN Procedures: None at this time.    Referral(s) or Consult(s): None at this time.  Medications administered during this visit: Ms. Newitt had no medications administered during this visit.  No future appointments.  Primary Care Physician: Sharyne Peach, MD Location: [facility] Outpatient Pain Management Facility Note by: Kathlen Brunswick Dossie Arbour, M.D, DABA, DABAPM, DABPM, DABIPP, FIPP  Pain Score Disclaimer: We use the NRS-11 scale. This is a self-reported, subjective measurement of pain severity with only modest accuracy. It is used primarily to identify changes within a particular patient. It must be understood that outpatient pain scales are significantly less accurate that those used for research, where they can be applied under ideal controlled circumstances with minimal exposure to variables. In reality, the score is likely to be a combination of pain intensity and  pain affect, where pain affect describes the degree of emotional arousal or changes in action readiness caused by the sensory experience of pain. Factors such as social and work situation, setting, emotional state, anxiety levels, expectation, and prior pain experience may influence pain perception and show large inter-individual differences that may also be affected by time variables.

## 2015-09-12 LAB — TOXASSURE SELECT 13 (MW), URINE: PDF: 0

## 2015-09-17 DIAGNOSIS — M5416 Radiculopathy, lumbar region: Secondary | ICD-10-CM

## 2015-09-17 HISTORY — DX: Radiculopathy, lumbar region: M54.16

## 2015-10-09 ENCOUNTER — Ambulatory Visit: Payer: Medicare PPO | Attending: Pain Medicine | Admitting: Pain Medicine

## 2015-10-09 ENCOUNTER — Encounter: Payer: Self-pay | Admitting: Pain Medicine

## 2015-10-09 VITALS — BP 100/62 | HR 61 | Temp 98.6°F | Resp 16 | Ht 66.0 in | Wt 190.0 lb

## 2015-10-09 DIAGNOSIS — G8929 Other chronic pain: Secondary | ICD-10-CM

## 2015-10-09 DIAGNOSIS — F1190 Opioid use, unspecified, uncomplicated: Secondary | ICD-10-CM | POA: Diagnosis not present

## 2015-10-09 DIAGNOSIS — G893 Neoplasm related pain (acute) (chronic): Secondary | ICD-10-CM | POA: Diagnosis not present

## 2015-10-09 DIAGNOSIS — Z79891 Long term (current) use of opiate analgesic: Secondary | ICD-10-CM | POA: Diagnosis not present

## 2015-10-09 MED ORDER — OXYCODONE HCL 5 MG PO TABS: 5.0000 mg | ORAL_TABLET | Freq: Four times a day (QID) | ORAL | Status: DC | PRN

## 2015-10-09 MED ORDER — MORPHINE SULFATE ER 15 MG PO TBCR: 15.0000 mg | EXTENDED_RELEASE_TABLET | Freq: Two times a day (BID) | ORAL | Status: DC

## 2015-10-09 NOTE — Progress Notes (Signed)
Safety precautions to be maintained throughout the outpatient stay will include: orient to surroundings, keep bed in low position, maintain call bell within reach at all times, provide assistance with transfer out of bed and ambulation. Oxycodone pill count # [id number]  Filled 09-07-15

## 2015-10-09 NOTE — Progress Notes (Signed)
Patient's Name: Elizabeth Monroe  Patient type: Established  MRN: [id number]  Service setting: Ambulatory outpatient  DOB: August 30, 1950  Location: [facility]  DOS: 10/09/2015  Primary Care Physician: Sharyne Peach, MD  Note by: Kathlen Brunswick. Dossie Arbour, M.D, DABA, DABAPM, DABPM, Milagros Evener, [place]  Referring Physician: Sharyne Peach, MD  Specialty: Board-Certified Interventional Pain Management  Last Visit to Pain Management: 09/06/2015   Primary Reason(s) for Visit: Encounter for prescription drug management (Level of risk: moderate) for terminal cancer patient. CC: Back Pain   HPI  [name] is a 65 y.o. year old, female patient, who returns today as an established patient. 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 Her primarily concern today is the Back Pain   Pain Assessment: Self-Reported Pain Score: 3  Reported level is compatible with observation Pain Type: Chronic pain Pain Location: Back Pain Orientation: Lower, Left Pain Descriptors / Indicators: Aching Pain Frequency: Constant  The patient comes into the clinics today for  pharmacological management of her chronic pain. I last saw this patient on 09/06/2015. Her body mass index is 30.68 kg/(m^2). The patient  reports that she does not use illicit drugs.  Date of Last Visit: 09/06/15 Service Provided on Last Visit: Med Refill  Controlled Substance Pharmacotherapy Assessment & REMS (Risk Evaluation and Mitigation Strategy)  Analgesic: No change since the last visit (see below). Pill Count: Please see the nurse's note for latest pill count information. MME/day: No change since the last visit (see below). Date of Last Visit: 09/06/15 Pharmacokinetics: Onset of action (Liberation/Absorption): Within expected pharmacological parameters Time to Peak effect (Distribution): Timing and results are as within normal expected parameters Duration of action (Metabolism/Excretion): Within normal limits for medication Pharmacodynamics: Analgesic Effect: More than 50% Activity Facilitation: Medication(s) allow patient to sit, stand, walk, and do the basic ADLs Perceived Effectiveness: Described as relatively effective, allowing for increase in activities of daily living (ADL) Side-effects or Adverse reactions: None reported Monitoring: [place] PMP: Online review of the past 62-month period conducted. Compliant with practice rules and regulations Medication Assessment Form: Reviewed. Patient indicates being compliant with therapy Treatment compliance: Compliant Risk Assessment: Aberrant Behavior: None observed today Substance Use Disorder (SUD) Risk Level: No change since last visit Risk of opioid abuse or dependence: 0.7-3.0% with doses ? 36 MME/day and 6.1-26% with doses ? 120 MME/day. Opioid Risk Tool (ORT) Score:  5 Moderate Risk for SUD (Score between 4-7) Depression Scale Score: PHQ-2: PHQ-2 Total Score: 2 21.1% Probability of major depressive disorder (2) PHQ-9: PHQ-9 Total Score: 8 Mild depression (5-9)  Pharmacologic Plan: No change in therapy, at this  time  Laboratory Chemistry  Inflammation Markers Lab Results  Component Value Date   ESRSEDRATE 39* 06/14/2015   CRP <0.5 06/14/2015    Renal Function Lab Results  Component  Value Date   BUN 17 06/14/2015   CREATININE 0.88 06/14/2015   GFRAA >60 06/14/2015   GFRNONAA >60 06/14/2015    Hepatic Function Lab Results  Component Value Date   AST 24 06/14/2015   ALT 18 06/14/2015   ALBUMIN 3.7 06/14/2015    Electrolytes Lab Results  Component Value Date   NA 140 06/14/2015   K 3.3* 06/14/2015   CL 109 06/14/2015   CALCIUM 8.7* 06/14/2015   MG 2.1 06/14/2015    Pain Modulating Vitamins Lab Results  Component Value Date   VD25OH 16.7* 06/14/2015   VITAMINB12 280 06/14/2015    Coagulation Parameters Lab Results  Component Value Date   INR 1.0 12/22/2013   LABPROT 13.1 12/22/2013    Note: I personally reviewed the above data. Results made available to patient.  Recent Diagnostic Imaging  No results found.  Meds  The patient has a current medication list which includes the following prescription(s): alcohol prep, anastrozole, aspirin ec, vitamin d3, furosemide, gabapentin, lactulose, losartan, melatonin, morphine, morphine, morphine, multi-vitamins, naloxone hcl, omeprazole, ondansetron, oxycodone, oxycodone, oxycodone, pantoprazole, promethazine, propranolol, psyllium, spironolactone, and sucralfate.  Current Outpatient Prescriptions on File Prior to Visit  Medication Sig  . Alcohol Swabs (ALCOHOL PREP) PADS   . anastrozole (ARIMIDEX) 1 MG tablet Take by mouth.  Marland Kitchen aspirin EC 81 MG tablet Take 81 mg by mouth daily.   . Cholecalciferol (VITAMIN D3) 2000 units capsule Take 1 capsule (2,000 Units total) by mouth daily.  . furosemide (LASIX) 40 MG tablet Take 40 mg by mouth daily.  Marland Kitchen lactulose (CHRONULAC) 10 GM/15ML solution Take by mouth every 4 (four) hours.  Marland Kitchen losartan (COZAAR) 25 MG tablet Take 25 mg by mouth daily.  . Melatonin 3 MG TABS Take by mouth.  Reported on 09/06/2015  . Naloxone HCl (NARCAN) 4 MG/0.1ML LIQD Place 1 Bottle into the nose once. , then call 911, repeat if needed in other nostril with new bottle.  Marland Kitchen omeprazole (PRILOSEC) 40 MG capsule TAKE 1 CAPSULE (40 MG TOTAL) BY MOUTH ONCE DAILY.  . pantoprazole (PROTONIX) 40 MG tablet Take 40 mg by mouth daily.   . promethazine (PHENERGAN) 25 MG tablet Take 25 mg by mouth every 6 (six) hours as needed for nausea or vomiting.  . propranolol (INDERAL) 10 MG tablet Take 10 mg by mouth 2 (two) times daily.  . psyllium (METAMUCIL) 58.6 % packet Take 1 packet by mouth daily.  Marland Kitchen spironolactone (ALDACTONE) 100 MG tablet Take 100 mg by mouth daily.    No current facility-administered medications on file prior to visit.    ROS  Constitutional: Denies any fever or chills Gastrointestinal: No reported hemesis, hematochezia, vomiting, or acute GI distress Musculoskeletal: Denies any acute onset joint swelling, redness, loss of ROM, or weakness Neurological: No reported episodes of acute onset apraxia, aphasia, dysarthria, agnosia, amnesia, paralysis, loss of coordination, or loss of consciousness  Allergies  Ms. Raskin has No Known Allergies.  [place]  Medical:  Ms. 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 Family: family history includes Cancer in her mother; Heart disease in her father; Hypertension in her father. Surgical:  has past surgical history that includes Abdominal hysterectomy; Brain surgery; Splenectomy, total; and Melanoma excision. Tobacco:  reports that she has quit smoking. She does not have any smokeless  tobacco history on file. Alcohol:  reports that she does not drink alcohol. Drug:   reports that she does not use illicit drugs.  Constitutional Exam  Vitals: Blood pressure 100/62, pulse 61, temperature 98.6 F (37 C), resp. rate 16, height 5\' 6"  (1.676 m), weight 190 lb (86.183 kg), SpO2 99 %. General appearance: Well nourished, well developed, and well hydrated. In no acute distress Calculated BMI/Body habitus: Body mass index is 30.68 kg/(m^2). (30-34.9 kg/m2) Obese (Class I) - 68% higher incidence of chronic pain Psych/Mental status: Alert and oriented x 3 (person, place, & time) Eyes: PERLA Respiratory: No evidence of acute respiratory distress  Cervical Spine Exam  Inspection: No masses, redness, or swelling Alignment: Symmetrical ROM: Functional: Unrestricted ROM Active: Adequate ROM Stability: No instability detected Muscle strength & Tone: Functionally intact Sensory: Unimpaired Palpation: No complaints of tenderness  Upper Extremity (UE) Exam    Side: Right upper extremity  Side: Left upper extremity  Inspection: No masses, redness, swelling, or asymmetry  Inspection: No masses, redness, swelling, or asymmetry  ROM:  ROM:  Functional: Unrestricted ROM  Functional: Unrestricted ROM  Active: Adequate ROM  Active: Adequate ROM  Muscle strength & Tone: Functionally intact  Muscle strength & Tone: Functionally intact  Sensory: Unimpaired  Sensory: Unimpaired  Palpation: Non-contributory  Palpation: Non-contributory   Thoracic Spine Exam  Inspection: No masses, redness, or swelling Alignment: Symmetrical ROM: Functional: Unrestricted ROM Active: Adequate ROM Stability: No instability detected Sensory: Unimpaired Muscle strength & Tone: Functionally intact Palpation: No complaints of tenderness  Lumbar Spine Exam  Inspection: No masses, redness, or swelling Alignment: Symmetrical ROM: Functional: Unrestricted ROM Active: Adequate ROM Stability: No instability detected Muscle strength & Tone: Functionally intact Sensory: Unimpaired Palpation:  No complaints of tenderness Provocative Tests: Lumbar Hyperextension and rotation test: deferred Patrick's Maneuver: deferred  Gait & Posture Assessment  Gait: Unaffected Posture: WNL  Lower Extremity Exam    Side: Right lower extremity  Side: Left lower extremity  Inspection: No masses, redness, swelling, or asymmetry ROM:  Inspection: No masses, redness, swelling, or asymmetry ROM:  Functional: Unrestricted ROM  Functional: Unrestricted ROM  Active: Adequate ROM  Active: Adequate ROM  Muscle strength & Tone: Functionally intact  Muscle strength & Tone: Functionally intact  Sensory: Unimpaired  Sensory: Unimpaired  Palpation: Non-contributory  Palpation: Non-contributory   Assessment & Plan  Primary Diagnosis & Pertinent Problem List: The primary encounter diagnosis was Cancer-related pain. Diagnoses of Chronic pain, Long term current use of opiate analgesic, and Opiate use (90 MME/Day) were also pertinent to this visit.  Visit Diagnosis: 1. Cancer-related pain   2. Chronic pain   3. Long term current use of opiate analgesic   4. Opiate use (90 MME/Day)     Problems updated and reviewed during this visit: Problem  Lumbar Radiculopathy  Carcinoma of Endometrium ([facility])    Problem-specific Plan(s): No problem-specific assessment & plan notes found for this encounter.  No new assessment & plan notes have been filed under this hospital service since the last note was generated. Service: Pain Management   Plan of Care   Problem List Items Addressed This Visit      High   Cancer-related pain - Primary (Chronic)   Relevant Medications   morphine (MS CONTIN) 15 MG 12 hr tablet   oxyCODONE (OXY IR/ROXICODONE) 5 MG immediate release tablet   morphine (MS CONTIN) 15 MG 12 hr tablet   oxyCODONE (OXY IR/ROXICODONE) 5 MG immediate release tablet   morphine (MS CONTIN)  15 MG 12 hr tablet   oxyCODONE (OXY IR/ROXICODONE) 5 MG immediate release tablet   Chronic pain (Chronic)    Relevant Medications   gabapentin (NEURONTIN) 300 MG capsule   morphine (MS CONTIN) 15 MG 12 hr tablet   oxyCODONE (OXY IR/ROXICODONE) 5 MG immediate release tablet   morphine (MS CONTIN) 15 MG 12 hr tablet   oxyCODONE (OXY IR/ROXICODONE) 5 MG immediate release tablet   morphine (MS CONTIN) 15 MG 12 hr tablet   oxyCODONE (OXY IR/ROXICODONE) 5 MG immediate release tablet     Medium   Long term current use of opiate analgesic (Chronic)   Opiate use (90 MME/Day) (Chronic)       Pharmacotherapy (Medications Ordered): Meds ordered this encounter  Medications  . morphine (MS CONTIN) 15 MG 12 hr tablet    Sig: Take 1 tablet (15 mg total) by mouth every 12 (twelve) hours.    Dispense:  60 tablet    Refill:  0    Do not add this medication to the electronic "Automatic Refill" notification system. Patient may have prescription filled one day early if pharmacy is closed on scheduled refill date. Do not fill until: 10/09/15 To last until: 11/08/15  . oxyCODONE (OXY IR/ROXICODONE) 5 MG immediate release tablet    Sig: Take 1-2 tablets (5-10 mg total) by mouth every 6 (six) hours as needed for severe pain or breakthrough pain.    Dispense:  240 tablet    Refill:  0    Do not add this medication to the electronic "Automatic Refill" notification system. Patient may have prescription filled one day early if pharmacy is closed on scheduled refill date. Do not fill until: 10/09/15 To last until: 11/08/15  . morphine (MS CONTIN) 15 MG 12 hr tablet    Sig: Take 1 tablet (15 mg total) by mouth every 12 (twelve) hours.    Dispense:  60 tablet    Refill:  0    Do not add this medication to the electronic "Automatic Refill" notification system. Patient may have prescription filled one day early if pharmacy is closed on scheduled refill date. Do not fill until: 11/08/15 To last until: 12/08/15  . oxyCODONE (OXY IR/ROXICODONE) 5 MG immediate release tablet    Sig: Take 1-2 tablets (5-10 mg total) by  mouth every 6 (six) hours as needed for severe pain or breakthrough pain.    Dispense:  240 tablet    Refill:  0    Do not add this medication to the electronic "Automatic Refill" notification system. Patient may have prescription filled one day early if pharmacy is closed on scheduled refill date. Do not fill until: 11/08/15 To last until: 12/08/15  . morphine (MS CONTIN) 15 MG 12 hr tablet    Sig: Take 1 tablet (15 mg total) by mouth every 12 (twelve) hours.    Dispense:  60 tablet    Refill:  0    Do not add this medication to the electronic "Automatic Refill" notification system. Patient may have prescription filled one day early if pharmacy is closed on scheduled refill date. Do not fill until: 12/08/15 To last until: 01/07/16  . oxyCODONE (OXY IR/ROXICODONE) 5 MG immediate release tablet    Sig: Take 1-2 tablets (5-10 mg total) by mouth every 6 (six) hours as needed for severe pain or breakthrough pain.    Dispense:  240 tablet    Refill:  0    Do not add this medication to the electronic "Automatic  Refill" notification system. Patient may have prescription filled one day early if pharmacy is closed on scheduled refill date. Do not fill until: 12/08/15 To last until: 01/07/16    [facility] & Procedure Ordered: No orders of the defined types were placed in this encounter.    Imaging Ordered: None  Interventional Therapies: Scheduled:  None at this time.    Considering:  None at this time.    PRN Procedures:  None at this time.    Referral(s) or Consult(s): None at this time.  New Prescriptions   No medications on file    Medications administered during this visit: Ms. Wonderly had no medications administered during this visit.  Requested PM Follow-up: Return in about 3 months (around 12/25/2015) for MedMgmt (3-Mo).  Future Appointments Date Time Provider [place]  12/27/2015 10:00 AM Milinda Pointer, MD [facility] None    Primary Care Physician: Sharyne Peach, MD Location: [facility] Note by: Kathlen Brunswick Dossie Arbour, M.D, DABA, DABAPM, DABPM, DABIPP, FIPP  Pain Score Disclaimer: We use the NRS-11 scale. This is a self-reported, subjective measurement of pain severity with only modest accuracy. It is used primarily to identify changes within a particular patient. It must be understood that outpatient pain scales are significantly less accurate that those used for research, where they can be applied under ideal controlled circumstances with minimal exposure to variables. In reality, the score is likely to be a combination of pain intensity and pain affect, where pain affect describes the degree of emotional arousal or changes in action readiness caused by the sensory experience of pain. Factors such as social and work situation, setting, emotional state, anxiety levels, expectation, and prior pain experience may influence pain perception and show large inter-individual differences that may also be affected by time variables.

## 2015-10-23 DIAGNOSIS — C541 Malignant neoplasm of endometrium: Secondary | ICD-10-CM | POA: Insufficient documentation

## 2015-10-24 DIAGNOSIS — I81 Portal vein thrombosis: Secondary | ICD-10-CM | POA: Insufficient documentation

## 2015-12-27 ENCOUNTER — Ambulatory Visit: Payer: Medicare PPO | Attending: Pain Medicine | Admitting: Pain Medicine

## 2015-12-27 ENCOUNTER — Encounter: Payer: Self-pay | Admitting: Pain Medicine

## 2015-12-27 VITALS — BP 116/75 | HR 66 | Temp 98.2°F | Resp 18 | Ht 64.0 in | Wt 193.0 lb

## 2015-12-27 DIAGNOSIS — B1920 Unspecified viral hepatitis C without hepatic coma: Secondary | ICD-10-CM | POA: Diagnosis not present

## 2015-12-27 DIAGNOSIS — C779 Secondary and unspecified malignant neoplasm of lymph node, unspecified: Secondary | ICD-10-CM | POA: Diagnosis not present

## 2015-12-27 DIAGNOSIS — C541 Malignant neoplasm of endometrium: Secondary | ICD-10-CM | POA: Diagnosis not present

## 2015-12-27 DIAGNOSIS — M5116 Intervertebral disc disorders with radiculopathy, lumbar region: Secondary | ICD-10-CM | POA: Diagnosis not present

## 2015-12-27 DIAGNOSIS — M792 Neuralgia and neuritis, unspecified: Secondary | ICD-10-CM | POA: Insufficient documentation

## 2015-12-27 DIAGNOSIS — Z79891 Long term (current) use of opiate analgesic: Secondary | ICD-10-CM | POA: Diagnosis not present

## 2015-12-27 DIAGNOSIS — N189 Chronic kidney disease, unspecified: Secondary | ICD-10-CM | POA: Diagnosis not present

## 2015-12-27 DIAGNOSIS — G893 Neoplasm related pain (acute) (chronic): Secondary | ICD-10-CM | POA: Diagnosis not present

## 2015-12-27 DIAGNOSIS — R109 Unspecified abdominal pain: Secondary | ICD-10-CM | POA: Diagnosis present

## 2015-12-27 DIAGNOSIS — F4310 Post-traumatic stress disorder, unspecified: Secondary | ICD-10-CM | POA: Insufficient documentation

## 2015-12-27 DIAGNOSIS — F338 Other recurrent depressive disorders: Secondary | ICD-10-CM | POA: Insufficient documentation

## 2015-12-27 DIAGNOSIS — I129 Hypertensive chronic kidney disease with stage 1 through stage 4 chronic kidney disease, or unspecified chronic kidney disease: Secondary | ICD-10-CM | POA: Diagnosis not present

## 2015-12-27 DIAGNOSIS — Z5181 Encounter for therapeutic drug level monitoring: Secondary | ICD-10-CM | POA: Diagnosis not present

## 2015-12-27 DIAGNOSIS — Z87891 Personal history of nicotine dependence: Secondary | ICD-10-CM | POA: Diagnosis not present

## 2015-12-27 DIAGNOSIS — E559 Vitamin D deficiency, unspecified: Secondary | ICD-10-CM | POA: Diagnosis not present

## 2015-12-27 DIAGNOSIS — I81 Portal vein thrombosis: Secondary | ICD-10-CM | POA: Insufficient documentation

## 2015-12-27 DIAGNOSIS — M542 Cervicalgia: Secondary | ICD-10-CM | POA: Diagnosis not present

## 2015-12-27 DIAGNOSIS — K7460 Unspecified cirrhosis of liver: Secondary | ICD-10-CM | POA: Diagnosis not present

## 2015-12-27 DIAGNOSIS — M545 Low back pain: Secondary | ICD-10-CM | POA: Diagnosis not present

## 2015-12-27 DIAGNOSIS — M25559 Pain in unspecified hip: Secondary | ICD-10-CM | POA: Diagnosis not present

## 2015-12-27 DIAGNOSIS — G8929 Other chronic pain: Secondary | ICD-10-CM | POA: Insufficient documentation

## 2015-12-27 DIAGNOSIS — Z85820 Personal history of malignant melanoma of skin: Secondary | ICD-10-CM | POA: Insufficient documentation

## 2015-12-27 DIAGNOSIS — R112 Nausea with vomiting, unspecified: Secondary | ICD-10-CM

## 2015-12-27 DIAGNOSIS — T50905A Adverse effect of unspecified drugs, medicaments and biological substances, initial encounter: Secondary | ICD-10-CM

## 2015-12-27 MED ORDER — MORPHINE SULFATE ER 15 MG PO TBCR: 15.0000 mg | EXTENDED_RELEASE_TABLET | Freq: Two times a day (BID) | ORAL | Status: DC

## 2015-12-27 MED ORDER — OXYCODONE HCL 10 MG PO TABS: 10.0000 mg | ORAL_TABLET | Freq: Every day | ORAL | Status: DC | PRN

## 2015-12-27 MED ORDER — ONDANSETRON HCL 4 MG PO TABS: 4.0000 mg | ORAL_TABLET | Freq: Three times a day (TID) | ORAL | Status: AC | PRN

## 2015-12-27 MED ORDER — GABAPENTIN 300 MG PO CAPS: 300.0000 mg | ORAL_CAPSULE | Freq: Three times a day (TID) | ORAL | Status: AC

## 2015-12-27 NOTE — Progress Notes (Signed)
Patient's Name: Elizabeth Monroe  Patient type: Established  MRN: [id number]  Service setting: Ambulatory outpatient  DOB: [birth year]  Location: [facility]  DOS: 12/27/2015  Primary Care Physician: Sharyne Peach, MD  Note by: Kathlen Brunswick. Dossie Arbour, M.D, DABA, DABAPM, DABPM, Milagros Evener, [place]  Referring Physician: Sharyne Peach, MD  Specialty: Board-Certified Interventional Pain Management  Last Visit to Pain Management: 10/09/2015   Primary Reason(s) for Visit: Encounter for prescription drug management (Level of risk: moderate) CC: Abdominal Pain   HPI  Elizabeth Monroe is a 65 y.o. year old, female patient, who returns today as an established patient. 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); Chronic low back pain; Chronic neck pain; Chronic hip pain; Endometrial cancer ([place]); Chronic pain of lower extremity; Chronic radicular pain of lower extremity; Arthropathy of hip; History of uterine cancer; Vitamin D deficiency; Malignant neoplasm of endometrium ([place]); Moderate episode of recurrent major depressive disorder ([place]); Liver cirrhosis ([place]); 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 Her primarily concern today is the Abdominal Pain   Pain Assessment: Self-Reported Pain Score: 3              Reported level is compatible with  observation       Pain Type: Chronic pain Pain Location: Abdomen Pain Descriptors / Indicators: Burning (states she has all sorts of pain and cant describe it.) Pain Frequency: Intermittent  The patient comes into the clinics today for pharmacological management of her chronic pain. I last saw this patient on 10/09/2015. The patient  reports that she does not use illicit drugs. Her body mass index is 33.11 kg/(m^2).  Date of Last Visit: 10/09/15 Service Provided on Last Visit: Med Refill  Controlled Substance Pharmacotherapy Assessment & REMS (Risk Evaluation and Mitigation Strategy)  Analgesic: Morphine ER 15 mg 1 twice a day (30 mg/day) + oxycodone IR 5 mg 1-2 tablets every 4-6 hours (6-8 tablets per day) (40 mg/day) MME/day: 90 mg/day Pill Count: Morphine pill count 15 mg #38/60 Filled 12-14-15. Oxycodone pill count # 166/240 Filled 12-14-15. Pharmacokinetics: Onset of action (Liberation/Absorption): Within expected pharmacological parameters Time to Peak effect (Distribution): Timing and results are as within normal expected parameters Duration of action (Metabolism/Excretion): Within normal limits for medication Pharmacodynamics: Analgesic Effect: More than 50% Activity Facilitation: Medication(s) allow patient to sit, stand, walk, and do the basic ADLs Perceived Effectiveness: Described as relatively effective, allowing for increase in activities of daily living (ADL) Side-effects or Adverse reactions: None reported Monitoring: [place] PMP: Online review of the past 3-month period conducted. Compliant with practice rules and regulations Last UDS on record: TOXASSURE SELECT 13  Date Value Ref Range Status  09/06/2015 FINAL  Final    Comment:    ==================================================================== TOXASSURE SELECT 13 (MW) ==================================================================== Test                             Result       Flag  Units Drug Present and  Declared for Prescription Verification   Morphine                       >2551        EXPECTED   ng/mg creat    Potential sources of large amounts of morphine in the absence of    codeine include administration of morphine or use of heroin.   Hydromorphone                  47           EXPECTED   ng/mg creat    Hydromorphone may be present as a metabolite of morphine;    concentrations of hydromorphone rarely exceed 5% of the morphine    concentration when this is the source of hydromorphone.   Oxycodone                      2024         EXPECTED   ng/mg creat   Oxymorphone                    198          EXPECTED   ng/mg creat   Noroxycodone                   >2551        EXPECTED   ng/mg creat   Noroxymorphone                 623          EXPECTED   ng/mg creat    Sources of oxycodone are scheduled prescription medications.    Oxymorphone, noroxycodone, and noroxymorphone are expected    metabolites of oxycodone. Oxymorphone is also available as a    scheduled prescription medication. ==================================================================== Test                      Result    Flag   Units      Ref Range   Creatinine              392              mg/dL      >=20 ==================================================================== Declared Medications:  The flagging and interpretation on this report are based on the  following declared medications.  Unexpected results may arise from  inaccuracies in the declared medications.  **Note: The testing scope of this panel includes these medications:  Morphine (MS Contin)  Oxycodone (Roxicodone)  **Note: The testing scope of this panel does not include following  reported medications:  Anastrozole (Arimidex)  Aspirin  Furosemide (Lasix)  Gabapentin  Lactulose  Losartan (Cozaar)  Megestrol (Megace)  Melatonin  Multivitamin  Omeprazole  Ondansetron (Zofran)  Pantoprazole (Protonix)  Promethazine (Phenergan)  Propranolol  (Inderal)  Psyllium (Metamucil)  Spironolactone (Aldactone)  Sucralfate (Carafate)  Tamoxifen ==================================================================== For clinical consultation, please call [phone]. ====================================================================    UDS interpretation: Compliant          Medication Assessment Form: Reviewed. Patient indicates being compliant with therapy Treatment compliance: Compliant Risk Assessment: Aberrant Behavior: None observed today Substance Use Disorder (SUD) Risk Level: No change since last visit Risk of opioid abuse or dependence: 0.7-3.0% with doses ? 36 MME/day and 6.1-26% with doses ? 120 MME/day. Opioid Risk Tool (ORT) Score: Total Score: 0 Low Risk for SUD (Score <3) Depression  Scale Score: PHQ-2: PHQ-2 Total Score: 0 No depression (0) PHQ-9: PHQ-9 Total Score: 0 No depression (0-4)  Pharmacologic Plan: No change in therapy, at this time  Laboratory Chemistry  Inflammation Markers Lab Results  Component Value Date   ESRSEDRATE 39* 06/14/2015   CRP <0.5 06/14/2015    Renal Function Lab Results  Component Value Date   BUN 17 06/14/2015   CREATININE 0.88 06/14/2015   GFRAA >60 06/14/2015   GFRNONAA >60 06/14/2015    Hepatic Function Lab Results  Component Value Date   AST 24 06/14/2015   ALT 18 06/14/2015   ALBUMIN 3.7 06/14/2015    Electrolytes Lab Results  Component Value Date   NA 140 06/14/2015   K 3.3* 06/14/2015   CL 109 06/14/2015   CALCIUM 8.7* 06/14/2015   MG 2.1 06/14/2015    Pain Modulating Vitamins Lab Results  Component Value Date   VD25OH 16.7* 06/14/2015   VITAMINB12 280 06/14/2015    Coagulation Parameters Lab Results  Component Value Date   INR 1.0 12/22/2013   LABPROT 13.1 12/22/2013   PLT 141* 04/04/2014    Cardiovascular Lab Results  Component Value Date   HGB 12.8 04/04/2014   HCT 38.7 04/04/2014    Note: Lab results reviewed.  Recent  Diagnostic Imaging  No results found.  Meds  The patient has a current medication list which includes the following prescription(s): alcohol prep, aspirin ec, vitamin d3, furosemide, gabapentin, lactulose, losartan, megestrol, melatonin, morphine, morphine, morphine, naloxone hcl, omeprazole, ondansetron, oxycodone, oxycodone, oxycodone, pantoprazole, promethazine, propranolol, psyllium, spironolactone, sucralfate, tamoxifen, oxycodone hcl, oxycodone hcl, and oxycodone hcl.  Current Outpatient Prescriptions on File Prior to Visit  Medication Sig  . Alcohol Swabs (ALCOHOL PREP) PADS   . aspirin EC 81 MG tablet Take 81 mg by mouth daily.   . Cholecalciferol (VITAMIN D3) 2000 units capsule Take 1 capsule (2,000 Units total) by mouth daily.  . furosemide (LASIX) 40 MG tablet Take 40 mg by mouth daily.  Marland Kitchen lactulose (CHRONULAC) 10 GM/15ML solution Take by mouth every 4 (four) hours.  Marland Kitchen losartan (COZAAR) 25 MG tablet Take 25 mg by mouth daily.  . Melatonin 3 MG TABS Take by mouth. Reported on 09/06/2015  . Naloxone HCl (NARCAN) 4 MG/0.1ML LIQD Place 1 Bottle into the nose once. , then call 911, repeat if needed in other nostril with new bottle.  Marland Kitchen omeprazole (PRILOSEC) 40 MG capsule TAKE 1 CAPSULE (40 MG TOTAL) BY MOUTH ONCE DAILY.  Marland Kitchen oxyCODONE (OXY IR/ROXICODONE) 5 MG immediate release tablet Take 1-2 tablets (5-10 mg total) by mouth every 6 (six) hours as needed for severe pain or breakthrough pain.  Marland Kitchen oxyCODONE (OXY IR/ROXICODONE) 5 MG immediate release tablet Take 1-2 tablets (5-10 mg total) by mouth every 6 (six) hours as needed for severe pain or breakthrough pain.  Marland Kitchen oxyCODONE (OXY IR/ROXICODONE) 5 MG immediate release tablet Take 1-2 tablets (5-10 mg total) by mouth every 6 (six) hours as needed for severe pain or breakthrough pain.  . pantoprazole (PROTONIX) 40 MG tablet Take 40 mg by mouth daily.   . promethazine (PHENERGAN) 25 MG tablet Take 25 mg by mouth every 6 (six) hours as needed for  nausea or vomiting.  . propranolol (INDERAL) 10 MG tablet Take 10 mg by mouth 2 (two) times daily.  . psyllium (METAMUCIL) 58.6 % packet Take 1 packet by mouth daily.  Marland Kitchen spironolactone (ALDACTONE) 100 MG tablet Take 100 mg by mouth daily.   . sucralfate (CARAFATE) 1  g tablet Take 1 g by mouth 4 (four) times daily -  with meals and at bedtime.    No current facility-administered medications on file prior to visit.    ROS  Constitutional: Denies any fever or chills Gastrointestinal: No reported hemesis, hematochezia, vomiting, or acute GI distress Musculoskeletal: Denies any acute onset joint swelling, redness, loss of ROM, or weakness Neurological: No reported episodes of acute onset apraxia, aphasia, dysarthria, agnosia, amnesia, paralysis, loss of coordination, or loss of consciousness  Allergies  Elizabeth Monroe has No Known Allergies.  Monroe  Medical:  Elizabeth Monroe  has a past medical history of Cardiac arrhythmia; Cirrhosis ([place]); Hepatitis; Fibromyalgia; Arthritis; Cancer ([place]); Depression; Anxiety; Hypertension; Bleeding gastrointestinal (04/26/2013); Deep vein thrombosis of portal vein (01/19/2015); Leg swelling (04/04/2015); and Malignant neoplasm of endometrium ([place]) (10/14/2013). Family: family history includes Cancer in her mother; Heart disease in her father; Hypertension in her father. Surgical:  has past surgical history that includes Abdominal hysterectomy; Brain surgery; Splenectomy, total; and Melanoma excision. Tobacco:  reports that she has quit smoking. She does not have any smokeless tobacco history on file. Alcohol:  reports that she does not drink alcohol. Drug:  reports that she does not use illicit drugs.  Constitutional Exam  Vitals: Blood pressure 116/75, pulse 66, temperature 98.2 F (36.8 C), resp. rate 18, height 5\' 4"  (1.626 m), weight 193 lb (87.544 kg), SpO2 100 %. General appearance: Well nourished, well developed, and well hydrated. In no acute  distress Calculated BMI/Body habitus: Body mass index is 33.11 kg/(m^2). (30-34.9 kg/m2) Obese (Class I) - 68% higher incidence of chronic pain Psych/Mental status: Alert and oriented x 3 (person, place, & time) Eyes: PERLA Respiratory: No evidence of acute respiratory distress  Cervical Spine Exam  Inspection: No masses, redness, or swelling Alignment: Symmetrical ROM: Functional: ROM is within functional limits [facility]) Stability: No instability detected Muscle strength & Tone: Functionally intact Sensory: Unimpaired Palpation: No complaints of tenderness  Upper Extremity (UE) Exam    Side: Right upper extremity  Side: Left upper extremity  Inspection: No masses, redness, swelling, or asymmetry  Inspection: No masses, redness, swelling, or asymmetry  ROM:  ROM:  Functional: ROM is within functional limits [facility])        Functional: ROM is within functional limits [facility])        Muscle strength & Tone: Functionally intact  Muscle strength & Tone: Functionally intact  Sensory: Unimpaired  Sensory: Unimpaired  Palpation: No complaints of tenderness  Palpation: No complaints of tenderness   Thoracic Spine Exam  Inspection: No masses, redness, or swelling Alignment: Symmetrical ROM: Functional: ROM is within functional limits [facility]) Stability: No instability detected Sensory: Unimpaired Muscle strength & Tone: Functionally intact Palpation: No complaints of tenderness  Lumbar Spine Exam  Inspection: No masses, redness, or swelling Alignment: Symmetrical ROM: Functional: ROM is within functional limits [facility]) Stability: No instability detected Muscle strength & Tone: Functionally intact Sensory: Unimpaired Palpation: No complaints of tenderness Provocative Tests: Lumbar Hyperextension and rotation test: evaluation deferred today       Patrick's Maneuver: evaluation deferred today              Gait & Posture Assessment  Ambulation: Unassisted Gait: Unaffected Posture: WNL    Lower Extremity Exam    Side: Right lower extremity  Side: Left lower extremity  Inspection: No masses, redness, swelling, or asymmetry ROM:  Inspection: No masses, redness, swelling, or asymmetry ROM:  Functional: ROM is within functional limits [facility])  Functional: ROM is within functional limits [facility])        Muscle strength & Tone: Functionally intact  Muscle strength & Tone: Functionally intact  Sensory: Unimpaired  Sensory: Unimpaired  Palpation: No complaints of tenderness  Palpation: No complaints of tenderness   Assessment & Plan  Primary Diagnosis & Pertinent Problem List: The primary encounter diagnosis was Chronic pain. Diagnoses of Cancer-related pain, Encounter for therapeutic drug level monitoring, Long term current use of opiate analgesic, Malignant neoplasm of endometrium ([place]), Drug-induced nausea and vomiting, and Neurogenic pain were also pertinent to this visit.  Visit Diagnosis: 1. Chronic pain   2. Cancer-related pain   3. Encounter for therapeutic drug level monitoring   4. Long term current use of opiate analgesic   5. Malignant neoplasm of endometrium ([place])   6. Drug-induced nausea and vomiting   7. Neurogenic pain     Problems updated and reviewed during this visit: Problem  Neurogenic Pain  Adenocarcinoma of Endometrium ([facility])  Malignant Melanoma ([facility])  Chronic Low Back Pain  Chronic Neck Pain  Pain of Metastatic Malignancy  Lumbar DDD  Drug-Induced Nausea and Vomiting  Lumbar Radiculopathy  Leg Pain  Chronic Viral Hepatitis C ([facility])  Chronic Kidney Disease  Deep Vein Thrombosis of Portal Vein   Overview:  Overview:  extending to SMV   Other Specified Soft Tissue Disorders  Neurosis, Posttraumatic   Overview:  Overview:  From emotional trauma  Overview:  From emotional trauma    Bleeding Gastrointestinal  Bp (High Blood Pressure)    Problem-specific Plan(s): No problem-specific assessment & plan notes found for this  encounter.  No new assessment & plan notes have been filed under this hospital service since the last note was generated. Service: Pain Management   Plan of Care   Problem List Items Addressed This Visit      High   Cancer-related pain (Chronic)   Relevant Medications   morphine (MS CONTIN) 15 MG 12 hr tablet   morphine (MS CONTIN) 15 MG 12 hr tablet   morphine (MS CONTIN) 15 MG 12 hr tablet   Oxycodone HCl 10 MG TABS   Oxycodone HCl 10 MG TABS   Oxycodone HCl 10 MG TABS   Chronic pain - Primary (Chronic)   Relevant Medications   morphine (MS CONTIN) 15 MG 12 hr tablet   morphine (MS CONTIN) 15 MG 12 hr tablet   morphine (MS CONTIN) 15 MG 12 hr tablet   Oxycodone HCl 10 MG TABS   Oxycodone HCl 10 MG TABS   Oxycodone HCl 10 MG TABS   gabapentin (NEURONTIN) 300 MG capsule   Malignant neoplasm of endometrium ([facility])   Relevant Medications   megestrol (MEGACE) 40 MG tablet   tamoxifen (NOLVADEX) 20 MG tablet   morphine (MS CONTIN) 15 MG 12 hr tablet   morphine (MS CONTIN) 15 MG 12 hr tablet   morphine (MS CONTIN) 15 MG 12 hr tablet   Oxycodone HCl 10 MG TABS   Oxycodone HCl 10 MG TABS   Oxycodone HCl 10 MG TABS   gabapentin (NEURONTIN) 300 MG capsule   Neurogenic pain (Chronic)   Relevant Medications   gabapentin (NEURONTIN) 300 MG capsule     Medium   Drug-induced nausea and vomiting (Chronic)   Relevant Medications   ondansetron (ZOFRAN) 4 MG tablet   Encounter for therapeutic drug level monitoring   Long term current use of opiate analgesic (Chronic)       Pharmacotherapy (Medications Ordered): Meds ordered  this encounter  Medications  . morphine (MS CONTIN) 15 MG 12 hr tablet    Sig: Take 1 tablet (15 mg total) by mouth every 12 (twelve) hours.    Dispense:  60 tablet    Refill:  0    Do not add this medication to the electronic "Automatic Refill" notification system. Patient may have prescription filled one day early if pharmacy is closed on scheduled refill  date. Do not fill until: 01/07/16 To last until: 02/06/16  . morphine (MS CONTIN) 15 MG 12 hr tablet    Sig: Take 1 tablet (15 mg total) by mouth every 12 (twelve) hours.    Dispense:  60 tablet    Refill:  0    Do not add this medication to the electronic "Automatic Refill" notification system. Patient may have prescription filled one day early if pharmacy is closed on scheduled refill date. Do not fill until: 02/06/16 To last until: 03/07/16  . morphine (MS CONTIN) 15 MG 12 hr tablet    Sig: Take 1 tablet (15 mg total) by mouth every 12 (twelve) hours.    Dispense:  60 tablet    Refill:  0    Do not add this medication to the electronic "Automatic Refill" notification system. Patient may have prescription filled one day early if pharmacy is closed on scheduled refill date. Do not fill until: 03/07/16 To last until: 04/06/16  . Oxycodone HCl 10 MG TABS    Sig: Take 1 tablet (10 mg total) by mouth 5 (five) times daily as needed.    Dispense:  150 tablet    Refill:  0    Do not add this medication to the electronic "Automatic Refill" notification system. Patient may have prescription filled one day early if pharmacy is closed on scheduled refill date. Do not fill until: 01/07/16 To last until: 02/06/16  . Oxycodone HCl 10 MG TABS    Sig: Take 1 tablet (10 mg total) by mouth 5 (five) times daily as needed.    Dispense:  150 tablet    Refill:  0    Do not add this medication to the electronic "Automatic Refill" notification system. Patient may have prescription filled one day early if pharmacy is closed on scheduled refill date. Do not fill until: 02/06/16 To last until: 03/07/16  . Oxycodone HCl 10 MG TABS    Sig: Take 1 tablet (10 mg total) by mouth 5 (five) times daily as needed.    Dispense:  150 tablet    Refill:  0    Do not add this medication to the electronic "Automatic Refill" notification system. Patient may have prescription filled one day early if pharmacy is closed on  scheduled refill date. Do not fill until: 03/07/16 To last until: 04/06/16  . ondansetron (ZOFRAN) 4 MG tablet    Sig: Take 1 tablet (4 mg total) by mouth every 8 (eight) hours as needed for nausea or vomiting.    Dispense:  60 tablet    Refill:  2    Do not add this medication to the electronic "Automatic Refill" notification system. Patient may have prescription filled one day early if pharmacy is closed on scheduled refill date.  . gabapentin (NEURONTIN) 300 MG capsule    Sig: Take 1 capsule (300 mg total) by mouth 3 (three) times daily.    Dispense:  90 capsule    Refill:  2    Do not add this medication to the electronic "Automatic Refill" notification  system. Patient may have prescription filled one day early if pharmacy is closed on scheduled refill date.    Lab-work & Procedure Ordered: No orders of the defined types were placed in this encounter.    Imaging Ordered: None  Interventional Therapies: Scheduled:  None at this time.    Considering:  None at this time.    PRN Procedures:  None at this time.    Referral(s) or Consult(s): None at this time.  New Prescriptions   OXYCODONE HCL 10 MG TABS    Take 1 tablet (10 mg total) by mouth 5 (five) times daily as needed.   OXYCODONE HCL 10 MG TABS    Take 1 tablet (10 mg total) by mouth 5 (five) times daily as needed.   OXYCODONE HCL 10 MG TABS    Take 1 tablet (10 mg total) by mouth 5 (five) times daily as needed.    Medications administered during this visit: Elizabeth Monroe had no medications administered during this visit.  Requested PM Follow-up: Return in about 3 months (around 03/18/2016) for Med-Mgmt, (3-Mo).  Future Appointments Date Time Provider [place]  03/18/2016 10:40 AM Milinda Pointer, MD [facility] None    Primary Care Physician: Sharyne Peach, MD Location: [facility] Note by: Kathlen Brunswick Dossie Arbour, M.D, DABA, DABAPM, DABPM, DABIPP, FIPP  Pain Score  Disclaimer: We use the NRS-11 scale. This is a self-reported, subjective measurement of pain severity with only modest accuracy. It is used primarily to identify changes within a particular patient. It must be understood that outpatient pain scales are significantly less accurate that those used for research, where they can be applied under ideal controlled circumstances with minimal exposure to variables. In reality, the score is likely to be a combination of pain intensity and pain affect, where pain affect describes the degree of emotional arousal or changes in action readiness caused by the sensory experience of pain. Factors such as social and work situation, setting, emotional state, anxiety levels, expectation, and prior pain experience may influence pain perception and show large inter-individual differences that may also be affected by time variables.  Patient instructions provided during this appointment: Patient Instructions  Oxycodone dose was increased to 10 mg from 5 mg.  When you run out of the 5 milligram tablets start taking only 1 of the 10 mg tablets 5 times per day.

## 2015-12-27 NOTE — Patient Instructions (Signed)
Oxycodone dose was increased to 10 mg from 5 mg.  When you run out of the 5 milligram tablets start taking only 1 of the 10 mg tablets 5 times per day.

## 2015-12-27 NOTE — Progress Notes (Signed)
Safety precautions to be maintained throughout the outpatient stay will include: orient to surroundings, keep bed in low position, maintain call bell within reach at all times, provide assistance with transfer out of bed and ambulation. Patient does not have a list of her medications and does not know exactly what she is taking.  Verified verbally, but patient states she is unsure.   Morphine pill count 15 mg  #38/60  Filled 12-14-15 Oxycodone pill count # 166/240  Filled 12-14-15

## 2016-03-18 ENCOUNTER — Encounter: Payer: Medicare PPO | Admitting: Pain Medicine

## 2016-04-23 ENCOUNTER — Ambulatory Visit: Payer: Medicare HMO | Attending: Pain Medicine | Admitting: Pain Medicine

## 2016-04-23 ENCOUNTER — Encounter: Payer: Self-pay | Admitting: Pain Medicine

## 2016-04-23 VITALS — HR 71 | Temp 98.0°F | Resp 16 | Ht 65.0 in | Wt 165.0 lb

## 2016-04-23 DIAGNOSIS — I129 Hypertensive chronic kidney disease with stage 1 through stage 4 chronic kidney disease, or unspecified chronic kidney disease: Secondary | ICD-10-CM | POA: Insufficient documentation

## 2016-04-23 DIAGNOSIS — G893 Neoplasm related pain (acute) (chronic): Secondary | ICD-10-CM | POA: Diagnosis not present

## 2016-04-23 DIAGNOSIS — R112 Nausea with vomiting, unspecified: Secondary | ICD-10-CM | POA: Diagnosis present

## 2016-04-23 DIAGNOSIS — M542 Cervicalgia: Secondary | ICD-10-CM | POA: Insufficient documentation

## 2016-04-23 DIAGNOSIS — Z79891 Long term (current) use of opiate analgesic: Secondary | ICD-10-CM | POA: Diagnosis not present

## 2016-04-23 DIAGNOSIS — Z79899 Other long term (current) drug therapy: Secondary | ICD-10-CM | POA: Insufficient documentation

## 2016-04-23 DIAGNOSIS — K7460 Unspecified cirrhosis of liver: Secondary | ICD-10-CM | POA: Insufficient documentation

## 2016-04-23 DIAGNOSIS — G894 Chronic pain syndrome: Secondary | ICD-10-CM | POA: Insufficient documentation

## 2016-04-23 DIAGNOSIS — C7951 Secondary malignant neoplasm of bone: Secondary | ICD-10-CM | POA: Diagnosis not present

## 2016-04-23 DIAGNOSIS — B182 Chronic viral hepatitis C: Secondary | ICD-10-CM | POA: Diagnosis not present

## 2016-04-23 DIAGNOSIS — N189 Chronic kidney disease, unspecified: Secondary | ICD-10-CM | POA: Insufficient documentation

## 2016-04-23 DIAGNOSIS — K922 Gastrointestinal hemorrhage, unspecified: Secondary | ICD-10-CM

## 2016-04-23 DIAGNOSIS — K7469 Other cirrhosis of liver: Secondary | ICD-10-CM

## 2016-04-23 DIAGNOSIS — E559 Vitamin D deficiency, unspecified: Secondary | ICD-10-CM | POA: Diagnosis not present

## 2016-04-23 DIAGNOSIS — E1122 Type 2 diabetes mellitus with diabetic chronic kidney disease: Secondary | ICD-10-CM | POA: Insufficient documentation

## 2016-04-23 DIAGNOSIS — E119 Type 2 diabetes mellitus without complications: Secondary | ICD-10-CM | POA: Insufficient documentation

## 2016-04-23 DIAGNOSIS — I81 Portal vein thrombosis: Secondary | ICD-10-CM | POA: Insufficient documentation

## 2016-04-23 DIAGNOSIS — F1190 Opioid use, unspecified, uncomplicated: Secondary | ICD-10-CM | POA: Diagnosis not present

## 2016-04-23 DIAGNOSIS — C541 Malignant neoplasm of endometrium: Secondary | ICD-10-CM | POA: Insufficient documentation

## 2016-04-23 MED ORDER — OXYCODONE HCL 10 MG PO TABS: 10.0000 mg | ORAL_TABLET | Freq: Every day | ORAL | 0 refills | Status: AC | PRN

## 2016-04-23 MED ORDER — MORPHINE SULFATE ER 15 MG PO TBCR: 15.0000 mg | EXTENDED_RELEASE_TABLET | Freq: Two times a day (BID) | ORAL | 0 refills | Status: DC

## 2016-04-23 NOTE — Progress Notes (Signed)
Nursing Pain Medication Assessment:  Safety precautions to be maintained throughout the outpatient stay will include: orient to surroundings, keep bed in low position, maintain call bell within reach at all times, provide assistance with transfer out of bed and ambulation.  Medication Inspection Compliance: Elizabeth Monroe did not comply with our request to bring her pills to be counted. She was reminded that bringing the medication bottles, even when empty, is a requirement. Pill Count: No pills available to be counted today. Bottle Appearance: No container available. Did not bring bottle(s) to appointment. Medication: None brought in. Filled Date: N/A

## 2016-04-23 NOTE — Progress Notes (Signed)
Patient's Name: Elizabeth Monroe  MRN: [id number]  Referring Provider: Sharyne Peach, MD  DOB: [birth year]  PCP: No primary care provider on file.  DOS: 04/23/2016  Note by: Kathlen Brunswick. Dossie Arbour, MD  Service setting: Ambulatory outpatient  Specialty: Interventional Pain Management  Location: [facility] (AMB) Pain Management Facility    Patient type: Established   Primary Reason(s) for Visit: Encounter for prescription drug management (Level of risk: moderate) CC: Abdominal Pain (left uoq)  HPI  Elizabeth Monroe is a 65 y.o. year old, female patient, who comes today for a medication management evaluation. 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); Chronic low back pain; Chronic neck pain; Chronic hip pain; Endometrial cancer ([place]); Chronic pain of lower extremity; Chronic radicular pain of lower extremity; Arthropathy of hip; History of uterine cancer; Vitamin D deficiency; Malignant neoplasm of endometrium ([place]); Moderate episode of recurrent major depressive disorder ([place]); Liver cirrhosis ([place]); 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 Her primarily concern today is the Abdominal Pain (left uoq)  Pain Assessment: Self-Reported Pain Score: 3 /10             Reported level is compatible with observation.       Pain Type: Chronic pain Pain  Location: Abdomen Pain Orientation: Left Pain Descriptors / Indicators: Dull Pain Frequency: Intermittent  [name] was last seen on 12/27/2015 for medication management. During today's appointment we reviewed Elizabeth Monroe chronic pain status, as well as her outpatient medication regimen.  The patient  reports that she does not use drugs. Her body mass index is 27.46 kg/m.  Further details on both, my assessment(s), as well as the proposed treatment plan, please see below.  Controlled Substance Pharmacotherapy Assessment REMS (Risk Evaluation and Mitigation Strategy)  Analgesic: Morphine ER 15 mg 1 twice a day (30 mg/day) + oxycodone IR 5 mg 1-2 tablets every 4-6 hours (6-8 tablets per day) (40 mg/day) MME/day: 90 mg/day  Landis Martins, RN  04/23/2016  8:18 AM  Sign at close encounter Nursing Pain Medication Assessment:  Safety precautions to be maintained throughout the outpatient stay will include: orient to surroundings, keep bed in low position, maintain call bell within reach at all times, provide assistance with transfer out of bed and ambulation.  Medication Inspection Compliance: Elizabeth Monroe did not comply with our request to bring her pills to be counted. She was reminded that bringing the medication bottles, even when empty, is a requirement. Pill Count: No pills available to be counted today. Bottle Appearance: No container available. Did not bring bottle(s) to appointment. Medication: None brought in. Filled Date: N/A Pharmacokinetics: Liberation and absorption (onset of action): WNL Distribution (time to peak effect): WNL Metabolism and excretion (duration of action): WNL         Pharmacodynamics: Desired effects: Analgesia: The patient reports >50% benefit. Reported improvement in function: The patient reports medication allows her to accomplish basic ADLs. Clinically meaningful improvement in function (CMIF): Sustained CMIF goals met Perceived effectiveness:  Described  as relatively effective, allowing for increase in activities of daily living (ADL) Undesirable effects: Side-effects or Adverse reactions: None reported Monitoring: [place] PMP: Online review of the past 33-monthperiod conducted. Compliant with practice rules and regulations List of all UDS test(s) done:  Lab Results  Component Value Date   TOXASSSELUR FINAL 09/06/2015   TOXASSSELUR FINAL 06/14/2015   Last UDS on record: ToxAssure Select 13  Date Value Ref Range Status  09/06/2015 FINAL  Final    Comment:    ==================================================================== TOXASSURE SELECT 13 (MW) ==================================================================== Test                             Result       Flag       Units Drug Present and Declared for Prescription Verification   Morphine                       >2551        EXPECTED   ng/mg creat    Potential sources of large amounts of morphine in the absence of    codeine include administration of morphine or use of heroin.   Hydromorphone                  47           EXPECTED   ng/mg creat    Hydromorphone may be present as a metabolite of morphine;    concentrations of hydromorphone rarely exceed 5% of the morphine    concentration when this is the source of hydromorphone.   Oxycodone                      2024         EXPECTED   ng/mg creat   Oxymorphone                    198          EXPECTED   ng/mg creat   Noroxycodone                   >2551        EXPECTED   ng/mg creat   Noroxymorphone                 623          EXPECTED   ng/mg creat    Sources of oxycodone are scheduled prescription medications.    Oxymorphone, noroxycodone, and noroxymorphone are expected    metabolites of oxycodone. Oxymorphone is also available as a    scheduled prescription medication. ==================================================================== Test                      Result    Flag   Units      Ref Range   Creatinine               392              mg/dL      >=20 ==================================================================== Declared Medications:  The flagging and interpretation on this report are based on the  following declared medications.  Unexpected results may arise from  inaccuracies in the declared medications.  **Note: The testing scope of this panel includes these medications:  Morphine (MS Contin)  Oxycodone (Roxicodone)  **Note: The testing scope of this panel does not include following  reported medications:  Anastrozole (Arimidex)  Aspirin  Furosemide (Lasix)  Gabapentin  Lactulose  Losartan (Cozaar)  Megestrol (Megace)  Melatonin  Multivitamin  Omeprazole  Ondansetron (Zofran)  Pantoprazole (Protonix)  Promethazine (Phenergan)  Propranolol (Inderal)  Psyllium (Metamucil)  Spironolactone (Aldactone)  Sucralfate (Carafate)  Tamoxifen ==================================================================== For clinical consultation, please call [phone]. ====================================================================    UDS interpretation: Compliant          Medication Assessment Form: Reviewed. Patient indicates being compliant with therapy Treatment compliance: Compliant Risk Assessment Profile: Aberrant behavior: See prior evaluations. None observed or detected today Comorbid factors increasing risk of overdose: See prior notes. No additional risks detected today Risk of substance use disorder (SUD): Low Opioid Risk Tool (ORT) Total Score: 4  Interpretation Table:  Score <3 = Low Risk for SUD  Score between 4-7 = Moderate Risk for SUD  Score >8 = High Risk for Opioid Abuse   Risk Mitigation Strategies:  Patient Counseling: Covered Patient-Prescriber Agreement (PPA): Present and active  Notification to other healthcare providers: Done  Pharmacologic Plan: No change in therapy, at this time  Laboratory Chemistry  Inflammation Markers Lab Results   Component Value Date   ESRSEDRATE 39 (H) 06/14/2015   CRP <0.5 06/14/2015   Renal Function Lab Results  Component Value Date   BUN 17 06/14/2015   CREATININE 0.88 06/14/2015   GFRAA >60 06/14/2015   GFRNONAA >60 06/14/2015   Hepatic Function Lab Results  Component Value Date   AST 24 06/14/2015   ALT 18 06/14/2015   ALBUMIN 3.7 06/14/2015   Electrolytes Lab Results  Component Value Date   NA 140 06/14/2015   K 3.3 (L) 06/14/2015   CL 109 06/14/2015   CALCIUM 8.7 (L) 06/14/2015   MG 2.1 06/14/2015   Pain Modulating Vitamins Lab Results  Component Value Date   VD25OH 16.7 (L) 06/14/2015   VITAMINB12 280 06/14/2015   Coagulation Parameters Lab Results  Component Value Date   INR 1.0 12/22/2013   LABPROT 13.1 12/22/2013   PLT 141 (L) 04/04/2014   Cardiovascular Lab Results  Component Value Date   HGB 12.8 04/04/2014   HCT 38.7 04/04/2014   Note: Lab results reviewed.  Recent Diagnostic Imaging Review  No results found. Note: Imaging results reviewed.  Meds  The patient has a current medication list which includes the following prescription(s): alcohol prep, true metrix level 1, true metrix air glucose meter, vitamin d3, furosemide, gabapentin, lactulose, losartan, melatonin, morphine, morphine, morphine, naloxone hcl, ondansetron, oxycodone hcl, oxycodone hcl, oxycodone hcl, pantoprazole, prochlorperazine, propranolol, psyllium, spironolactone, true metrix blood glucose test, trueplus lancets 28g, and spironolactone.  Current Outpatient Prescriptions on File Prior to Visit  Medication Sig  . Alcohol Swabs (ALCOHOL PREP) PADS   . Cholecalciferol (VITAMIN D3) 2000 units capsule Take 1 capsule (2,000 Units total) by mouth daily.  Marland Kitchen gabapentin (NEURONTIN) 300 MG capsule Take 1 capsule (300 mg total) by mouth 3 (three) times daily.  Marland Kitchen losartan (COZAAR) 25 MG tablet Take 25 mg by mouth daily.  . Naloxone HCl (NARCAN) 4 MG/0.1ML LIQD Place 1 Bottle into the nose  once. , then call 911, repeat if needed in other nostril with new bottle.  . ondansetron (ZOFRAN) 4 MG tablet Take 1 tablet (4 mg total) by mouth every 8 (eight) hours as needed for nausea or vomiting.  . pantoprazole (PROTONIX) 40 MG tablet Take 40 mg by mouth daily.   . propranolol (INDERAL) 10 MG tablet Take 10 mg by mouth 2 (two) times daily.  Marland Kitchen  psyllium (METAMUCIL) 58.6 % packet Take 1 packet by mouth daily.  Marland Kitchen spironolactone (ALDACTONE) 100 MG tablet Take 100 mg by mouth daily.    No current facility-administered medications on file prior to visit.    ROS  Constitutional: Denies any fever or chills Gastrointestinal: No reported hemesis, hematochezia, vomiting, or acute GI distress Musculoskeletal: Denies any acute onset joint swelling, redness, loss of ROM, or weakness Neurological: No reported episodes of acute onset apraxia, aphasia, dysarthria, agnosia, amnesia, paralysis, loss of coordination, or loss of consciousness  Allergies  Ms. Witte has No Known Allergies.  [place]  Drug: Ms. Printup  reports that she does not use drugs. Alcohol:  reports that she does not drink alcohol. Tobacco:  reports that she has quit smoking. She does not have any smokeless tobacco history on file. 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 Family: family history includes Cancer in her mother; Heart disease in her father; Hypertension in her father.  Past Surgical History:  Procedure Laterality Date  . ABDOMINAL HYSTERECTOMY    . BRAIN SURGERY    . MELANOMA EXCISION    . SPLENECTOMY, TOTAL     Constitutional Exam  General appearance: Well nourished, well  developed, and well hydrated. In no apparent acute distress Vitals:   04/23/16 0809  Pulse: 71  Resp: 16  Temp: 98 F (36.7 C)  TempSrc: Oral  SpO2: 100%  Weight: 165 lb (74.8 kg)  Height: '5\' 5"'$  (1.651 m)   BMI Assessment: Estimated body mass index is 27.46 kg/m as calculated from the following:   Height as of this encounter: '5\' 5"'$  (1.651 m).   Weight as of this encounter: 165 lb (74.8 kg).  BMI interpretation table: BMI level Category Range association with higher incidence of chronic pain  <18 kg/m2 Underweight   18.5-24.9 kg/m2 Ideal body weight   25-29.9 kg/m2 Overweight Increased incidence by 20%  30-34.9 kg/m2 Obese (Class I) Increased incidence by 68%  35-39.9 kg/m2 Severe obesity (Class II) Increased incidence by 136%  >40 kg/m2 Extreme obesity (Class III) Increased incidence by 254%   BMI Readings from Last 4 Encounters:  04/23/16 27.46 kg/m  12/27/15 33.13 kg/m  10/09/15 30.67 kg/m  09/06/15 33.13 kg/m   Wt Readings from Last 4 Encounters:  04/23/16 165 lb (74.8 kg)  12/27/15 193 lb (87.5 kg)  10/09/15 190 lb (86.2 kg)  09/06/15 193 lb (87.5 kg)  Psych/Mental status: Alert, oriented x 3 (person, place, & time) Eyes: PERLA Respiratory: No evidence of acute respiratory distress  Cervical Spine Exam  Inspection: No masses, redness, or swelling Alignment: Symmetrical Functional ROM: Unrestricted ROM Stability: No instability detected Muscle strength & Tone: Functionally intact Sensory: Unimpaired Palpation: Non-contributory  Upper Extremity (UE) Exam    Side: Right upper extremity  Side: Left upper extremity  Inspection: No masses, redness, swelling, or asymmetry  Inspection: No masses, redness, swelling, or asymmetry  Functional ROM: Unrestricted ROM         Functional ROM: Unrestricted ROM          Muscle strength & Tone: Functionally intact  Muscle strength & Tone: Functionally intact  Sensory: Unimpaired  Sensory: Unimpaired  Palpation:  Non-contributory  Palpation: Non-contributory   Thoracic Spine Exam  Inspection: No masses, redness, or swelling Alignment:  Symmetrical Functional ROM: Unrestricted ROM Stability: No instability detected Sensory: Unimpaired Muscle strength & Tone: Functionally intact Palpation: Non-contributory  Lumbar Spine Exam  Inspection: No masses, redness, or swelling Alignment: Symmetrical Functional ROM: Unrestricted ROM Stability: No instability detected Muscle strength & Tone: Functionally intact Sensory: Unimpaired Palpation: Non-contributory Provocative Tests: Lumbar Hyperextension and rotation test: evaluation deferred today       Patrick's Maneuver: evaluation deferred today              Gait & Posture Assessment  Ambulation: Unassisted Gait: Relatively normal for age and body habitus Posture: WNL   Lower Extremity Exam    Side: Right lower extremity  Side: Left lower extremity  Inspection: No masses, redness, swelling, or asymmetry  Inspection: No masses, redness, swelling, or asymmetry  Functional ROM: Unrestricted ROM          Functional ROM: Unrestricted ROM          Muscle strength & Tone: Functionally intact  Muscle strength & Tone: Functionally intact  Sensory: Unimpaired  Sensory: Unimpaired  Palpation: Non-contributory  Palpation: Non-contributory   Assessment  Primary Diagnosis & Pertinent Problem List: The primary encounter diagnosis was Cancer-related pain. 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  Visit Diagnosis: 1. Cancer-related pain   2. Chronic pain  syndrome   3. Opiate use (90 MME/Day)   4. Long term prescription opiate use   5. 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 Endometrial cancer ([place])   7. 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 Chronic kidney disease, unspecified CKD stage   9. Chronic hepatitis C without hepatic coma ([place])   10. Other cirrhosis of liver ([place])   11. Vitamin D deficiency   12. Gastrointestinal hemorrhage, unspecified gastrointestinal hemorrhage type    Plan of Care  Pharmacotherapy (Medications Ordered): Meds ordered this encounter  Medications  . Oxycodone HCl 10 MG TABS    Sig: Take 1 tablet (10 mg total) by mouth 5 (five) times daily as needed.    Dispense:  150 tablet    Refill:  0    Do not add this medication to the electronic "Automatic Refill" notification system. Patient may have prescription filled one day early if pharmacy is closed on scheduled refill date. Do not fill until: 04/23/16 To last until: 05/23/16  . Oxycodone HCl 10 MG TABS    Sig: Take 1 tablet (10 mg total) by mouth 5 (five) times daily as needed.    Dispense:  150 tablet    Refill:  0    Do not add this medication to the electronic "Automatic Refill" notification system. Patient may have prescription filled one day early if pharmacy is closed on scheduled refill date. Do not fill until: 05/23/16 To last until: 06/22/16  . Oxycodone HCl 10 MG TABS    Sig: Take 1 tablet (10 mg total) by mouth 5 (five) times daily as needed.    Dispense:  150 tablet    Refill:  0    Do not add this medication to the electronic "Automatic Refill" notification system. Patient may have prescription filled one day early if pharmacy is closed on scheduled refill date. Do not fill until: 06/22/16  To last until: 07/22/16  . morphine (MS CONTIN) 15 MG 12 hr tablet    Sig: Take 1 tablet (15 mg total) by mouth every 12 (twelve) hours.    Dispense:  60 tablet    Refill:  0    Do not add this medication to the electronic "Automatic Refill"  notification system. Patient may have prescription filled one day early if pharmacy is closed on scheduled refill date. Do not fill until: 05/23/16 To last until: 06/22/16  . morphine (MS CONTIN) 15 MG 12 hr tablet    Sig: Take 1 tablet (15 mg total) by mouth every 12 (twelve) hours.    Dispense:  60 tablet    Refill:  0    Do not add this medication to the electronic "Automatic Refill" notification system. Patient may have prescription filled one day early if pharmacy is closed on scheduled refill date. Do not fill until: 04/23/16 To last until: 05/23/16  . morphine (MS CONTIN) 15 MG 12 hr tablet    Sig: Take 1 tablet (15 mg total) by mouth every 12 (twelve) hours.    Dispense:  60 tablet    Refill:  0    Do not add this medication to the electronic "Automatic Refill" notification system. Patient may have prescription filled one day early if pharmacy is closed on scheduled refill date. Do not fill until: 06/22/16 To last until: 07/22/16   New Prescriptions   No medications on file   Medications administered today: Ms. Gasner had no medications administered during this visit. Lab-work, procedure(s), and/or referral(s): Orders Placed This Encounter  Procedures  . Ambulatory referral to Internal Medicine   Imaging and/or referral(s): AMB REFERRAL TO INTERNAL MEDICINE  Interventional therapies: Planned, scheduled, and/or pending:   None at this time.    Considering:   None at this time.    Palliative PRN treatment(s):   Not at this time.   Provider-requested follow-up: Return in about 3 months (around 07/24/2016) for Med-Mgmt.  Future Appointments Date Time Provider [place]  07/23/2016 11:15 AM Milinda Pointer, MD [facility] None   Primary Care Physician: No primary care provider on file. Location: [facility] Note by: Jahzier Villalon A. Dossie Arbour, M.D, DABA, DABAPM, DABPM, DABIPP, FIPP  Pain Score Disclaimer: We use the NRS-11 scale.  This is a self-reported, subjective measurement of pain severity with only modest accuracy. It is used primarily to identify changes within a particular patient. It must be understood that outpatient pain scales are significantly less accurate that those used for research, where they can be applied under ideal controlled circumstances with minimal exposure to variables. In reality, the score is likely to be a combination of pain intensity and pain affect, where pain affect describes the degree of emotional arousal or changes in action readiness caused by the sensory experience of pain. Factors such as social and work situation, setting, emotional state, anxiety levels, expectation, and prior pain experience may influence pain perception and show large inter-individual differences that may also be affected by time variables.  Patient instructions provided during this appointment: There are no Patient Instructions on file for this visit.

## 2016-05-14 ENCOUNTER — Encounter: Payer: Self-pay | Admitting: *Deleted

## 2016-05-14 ENCOUNTER — Emergency Department
Admission: EM | Admit: 2016-05-14 | Discharge: 2016-05-14 | Disposition: A | Discharge status: Home / Self Care | Payer: Medicare HMO | Attending: Emergency Medicine | Admitting: Emergency Medicine

## 2016-05-14 DIAGNOSIS — I129 Hypertensive chronic kidney disease with stage 1 through stage 4 chronic kidney disease, or unspecified chronic kidney disease: Secondary | ICD-10-CM | POA: Diagnosis not present

## 2016-05-14 DIAGNOSIS — R109 Unspecified abdominal pain: Secondary | ICD-10-CM

## 2016-05-14 DIAGNOSIS — G893 Neoplasm related pain (acute) (chronic): Secondary | ICD-10-CM

## 2016-05-14 DIAGNOSIS — N189 Chronic kidney disease, unspecified: Secondary | ICD-10-CM | POA: Insufficient documentation

## 2016-05-14 DIAGNOSIS — Z79899 Other long term (current) drug therapy: Secondary | ICD-10-CM | POA: Insufficient documentation

## 2016-05-14 DIAGNOSIS — Z8542 Personal history of malignant neoplasm of other parts of uterus: Secondary | ICD-10-CM | POA: Diagnosis not present

## 2016-05-14 DIAGNOSIS — E1122 Type 2 diabetes mellitus with diabetic chronic kidney disease: Secondary | ICD-10-CM | POA: Diagnosis not present

## 2016-05-14 DIAGNOSIS — Z87891 Personal history of nicotine dependence: Secondary | ICD-10-CM | POA: Diagnosis not present

## 2016-05-14 DIAGNOSIS — G894 Chronic pain syndrome: Secondary | ICD-10-CM

## 2016-05-14 MED ORDER — MORPHINE SULFATE ER 30 MG PO TBCR: EXTENDED_RELEASE_TABLET | ORAL | 0 refills | Status: AC

## 2016-05-14 MED ORDER — HYDROMORPHONE HCL 1 MG/ML IJ SOLN
1.0000 mg | Freq: Once | INTRAMUSCULAR | Status: AC
  Administered 2016-05-14: 1 mg via INTRAMUSCULAR
  Filled 2016-05-14: qty 1

## 2016-05-14 NOTE — Discharge Instructions (Signed)
Please continue to take 15 mg of your extended release morphine in the morning. In the evening, increase your dose to 30 mg, 2 tablets. You'll have enough extra tablets in your prescription until you are able to make a long-term pain management plan with your pain doctor.  Return to the emergency department if you develop new or severe pain, fever, inability to keep down fluids, or any other symptoms concerning to you.

## 2016-05-14 NOTE — ED Notes (Signed)
D/c int topt.  Pt to lobby vi wheelchair. Pt waiting on car ride.  First nurse tina rn aware.

## 2016-05-14 NOTE — ED Triage Notes (Addendum)
Pt arrives via EMS, pt has diagnosis of cancer of the lymph nodes and states she has a care plan meeting tomorrow but states the pain is too much to handle, states she has oxycodone at home and sees a pain clinic but states no relief

## 2016-05-14 NOTE — Progress Notes (Signed)
The Nurse asked [facility] rounding the ED unit to visit with the Pt. Pt was alert abut in pain and asked the [facility] to call the Nurse for her. [facility] called the Nurse, and when [facility] returned, Pt asked [place] for prayers, which the Ch, provided. Pt did not want family to be contacted.    05/14/16 1600  Clinical Encounter Type  Visited With Patient  Visit Type Initial;Spiritual support  Referral From Nurse  Consult/Referral To Chaplain  Spiritual Encounters  Spiritual Needs Prayer;Other (Comment)

## 2016-05-14 NOTE — ED Notes (Signed)
Pain meds given  siderails up x 2 

## 2016-05-14 NOTE — ED Notes (Signed)
Resumed care from [place] rn.  Pt alert.  Er md at bedside now.  Pt reports abd pain.

## 2016-05-14 NOTE — ED Provider Notes (Signed)
[facility] Emergency Department Provider Note  ____________________________________________  Time seen: Approximately 3:26 PM  I have reviewed the triage vital signs and the nursing notes.   HISTORY  Chief Complaint Abdominal Pain    HPI Elizabeth Monroe is a 65 y.o. 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 The patient takes 15 mg of morphine ER, and 5-10 mg of oxycodone every 6-8 hours when necessary. She states that her pain is now longer responsive. She is not having any other associated symptoms including nausea or vomiting, diarrhea or constipation, fever. This pain is similar in character and slightly worse in severity. The patient recently underwent CT imaging on 05/07/16 which showed the following: -- Mild increase in size of left para-aortic necrotic nodal mass, causing mild rightward displacement of the aorta and obstruction of the left ureter with new upstream moderate hydronephrosis.  -- Mild interval decrease in size of necrotic left inguinal nodal mass.  -- New reticular and patchy groundglass opacities, predominantly in the left lung, somewhat nonspecific and may represent infection, fibrosis, or drug toxicity. No new or enlarged pulmonary nodules suggestive of pulmonary metastatic disease. Attention on follow-up exams.   The patient has an appointment this week to talk to her oncologist at [facility], and an appoint with her pain doctor to discuss her pain management plan.    Past Medical History:  Diagnosis Date  . Anxiety   . Arthritis   . Bleeding gastrointestinal 04/26/2013  . Cancer ([place])   . Cardiac arrhythmia   . Cirrhosis ([place])   . 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 Depression   .  Fibromyalgia   . Hepatitis   . Hypertension   . Leg pain 06/14/2015  . Leg swelling 04/04/2015  . Lumbar radiculopathy 09/17/2015  . Malignant neoplasm of endometrium ([place]) 10/14/2013    Patient Active Problem List   Diagnosis Date Noted  . Chronic pain syndrome 04/23/2016  . Diabetes mellitus ([place]) 04/23/2016  . Drug-induced nausea and vomiting 12/27/2015  . Neurogenic pain 12/27/2015  . Deep vein thrombosis of portal vein 10/24/2015  . Adenocarcinoma of endometrium ([place]) 10/23/2015  . Moderate episode of recurrent major depressive disorder ([place]) 09/06/2015  . Liver cirrhosis ([place]) 09/06/2015  . 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 Vitamin D deficiency 07/03/2015  . Malignant melanoma ([place]) 06/14/2015  . Chronic viral hepatitis C ([place]) 06/14/2015  . Chronic kidney disease 06/14/2015  . Long term current use of opiate analgesic 06/14/2015  . Long term prescription opiate use 06/14/2015  . Opiate use (90 MME/Day) 06/14/2015  . Encounter for therapeutic drug level monitoring 06/14/2015  . Cancer-related pain 06/14/2015  . History of metastatic endometrial cancer (metastases to lymph nodes) 06/14/2015  . Chronic low back pain 06/14/2015  . Chronic neck pain 06/14/2015  . Chronic hip pain 06/14/2015  . Endometrial cancer ([place]) 06/14/2015  . Chronic pain of lower extremity 06/14/2015  . Chronic radicular pain of lower extremity 06/14/2015  . Arthropathy of hip 06/14/2015  . History of uterine cancer 06/14/2015  . Pain of metastatic malignancy 06/14/2015  . Other specified soft tissue disorders 04/04/2015  . Malignant neoplasm of endometrium ([place]) 10/14/2013  . 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 Lumbar DDD 04/26/2013  . Bleeding gastrointestinal 04/26/2013  . BP (  high blood pressure) 03/01/2013  . Hypertension 03/01/2013  . HCV (hepatitis C virus) 10/20/2012    Past Surgical History:  Procedure Laterality Date  . ABDOMINAL HYSTERECTOMY    . BRAIN SURGERY    .  MELANOMA EXCISION    . SPLENECTOMY, TOTAL      Current Outpatient Rx  . Order #: [id number] Class: Historical Med  . Order #: [id number] Class: Historical Med  . Order #: [id number] Class: Historical Med  . Order #: [id number] Class: Normal  . Order #: [id number] Class: Historical Med  . Order #: [id number] Class: Normal  . Order #: [id number] Class: Historical Med  . Order #: [id number] Class: Historical Med  . Order #: [id number] Class: Historical Med  . Order #: [id number] Class: Print  . Order #: [id number] Class: Normal  . Order #: [id number] Class: Normal  . Order #: [id number] Class: Print  . [START ON 05/23/2016] Order #: [id number] Class: Print  . [START ON 06/22/2016] Order #: [id number] Class: Print  . Order #: [id number] Class: Historical Med  . Order #: [id number] Class: Historical Med  . Order #: [id number] Class: Historical Med  . Order #: [id number] Class: Historical Med  . Order #: [id number] Class: Historical Med  . Order #: [id number] Class: Historical Med  . Order #: [id number] Class: Historical Med  . Order #: [id number] Class: Historical Med    Allergies Patient has no known allergies.  Family History  Problem Relation Age of Onset  . Cancer Mother   . Heart disease Father   . Hypertension Father     Social History Social History  Substance Use Topics  . Smoking status: Former Research scientist (life sciences)  . Smokeless tobacco: Not on file  . Alcohol use No    Review of Systems Constitutional: No fever/chills.No lightheadedness or syncope. Eyes: No visual changes. ENT:  No congestion or rhinorrhea. Cardiovascular: Denies chest pain. Denies palpitations. Respiratory: Denies shortness of breath.  No cough. Gastrointestinal: Positive left-sided abdominal and left flank pain. No nausea, no vomiting.  No diarrhea.  No constipation. Genitourinary: Negative for dysuria. Musculoskeletal: Positive for left flank pain. Skin: Negative for rash. Neurological: Negative for headaches. No focal numbness, tingling or  weakness.   10-point ROS otherwise negative.  ____________________________________________   PHYSICAL EXAM:  VITAL SIGNS: ED Triage Vitals  Enc Vitals Group     BP 05/14/16 1434 119/66     Pulse Rate 05/14/16 1434 71     Resp 05/14/16 1434 18     Temp 05/14/16 1434 98.1 F (36.7 C)     Temp Source 05/14/16 1434 Oral     SpO2 05/14/16 1434 98 %     Weight 05/14/16 1434 165 lb (74.8 kg)     Height 05/14/16 1434 5\' 4"  (1.626 m)     Head Circumference --      Peak Flow --      Pain Score 05/14/16 1435 10     Pain Loc --      Pain Edu? --      Excl. in [place]? --     Constitutional: Alert and oriented. Chronically ill appearing but in no acute distress. Answers questions appropriately. Eyes: Conjunctivae are normal.  EOMI. No scleral icterus. Head: Atraumatic. Nose: No congestion/rhinnorhea. Mouth/Throat: Mucous membranes are moist.  Neck: No stridor.  Supple.  No JVD. No meningismus. Cardiovascular: Normal rate, regular rhythm. No murmurs, rubs or gallops.  Respiratory: Normal respiratory effort.  No accessory muscle use or retractions. Lungs CTAB.  No wheezes, rales or ronchi. Gastrointestinal: Soft, and nondistended. I am unable to reproduce any pain in the abdomen. The patient states  she has left flank pain but I am unable to reproduce that pain with palpation as well. No guarding or rebound.  No peritoneal signs. Musculoskeletal: Bilateral symmetric lower extremity edema that is pitting to the mid tibial shaft. Neurologic:  A&Ox3.  Speech is clear.  Face and smile are symmetric.  EOMI.  Moves all extremities well. Skin:  Skin is warm, dry and intact. No rash noted. Psychiatric: Depressed mood and flat affect. Speech and behavior are normal.  Normal judgement.  ____________________________________________   LABS (all labs ordered are listed, but only abnormal results are displayed)  Labs Reviewed - No data to  display ____________________________________________  EKG  Not indicated ____________________________________________  RADIOLOGY  No results found.  ____________________________________________   PROCEDURES  Procedure(s) performed: None  Procedures  Critical Care performed: No ____________________________________________   INITIAL IMPRESSION / ASSESSMENT AND PLAN / ED COURSE  Pertinent labs & imaging results that were available during my care of the patient were reviewed by me and considered in my medical decision making (see chart for details).  65 y.o. female with a history of endometrial cancer, multiple necrotic masses in the abdomen that have been getting progressively worse according to recent imaging, presenting with increased severity of the pain that is similar in character to her chronic pain. She does not have any other associated systemic symptoms that would be concerning for an acute infection, or new process. Her vital signs are reassuring, she is afebrile with a normal heart rate. At this time, it is likely that the patient is having acute on chronic pain, and there is no clinical evidence that would be suggestive of a new process. I have given her intramuscular Dilaudid, and have attempted to speak with her pain physician for further recommendations. So far, have been unable to reach him, and we'll plan to discharge the patient home with instructions to increase her evening dose to 30 mg, since she states her pain is worse at night. She will continue to take 15 mg in the morning. I have discussed this with her, and also let her know that I will be giving her 6 tablets, which would be sufficient until she is able to see her pain doctor to clarify long-term pain management plan. The patient understands follow-up instructions and return precautions.   ____________________________________________  FINAL CLINICAL IMPRESSION(S) / ED DIAGNOSES  Final diagnoses:  Left  flank pain  Left lateral abdominal pain    Clinical Course       NEW MEDICATIONS STARTED DURING THIS VISIT:  New Prescriptions   MORPHINE (MS CONTIN) 30 MG 12 HR TABLET    15mg  PO in the morning, 30mg  PO in the evening.      Eula Listen, MD 05/14/16 [phone]

## 2016-05-17 ENCOUNTER — Telehealth: Payer: Self-pay | Admitting: Pain Medicine

## 2016-05-17 ENCOUNTER — Ambulatory Visit: Payer: Medicare HMO | Admitting: Pain Medicine

## 2016-05-17 NOTE — Telephone Encounter (Signed)
Patient in too much pain to come for appt today per Lattie Haw from [facility]. meds are not helping with pain and they would like to speak with nurse about what to do as patient is not able to come in for appts.

## 2016-05-21 NOTE — Telephone Encounter (Signed)
I would think that when you're having pain that is when you need to come in to the pain clinic since that is when we can probably evaluate the pain the best. I definitely will not treat anybody over the phone. The other alternative is that of the pain is really bad, then she should come to the emergency department.

## 2016-05-22 NOTE — Telephone Encounter (Signed)
Spoke with patient, informed her that Dr. Dossie Arbour does not treat pain over the phone, she needs an evaluation. Patient states hospice is now treating her pain.

## 2016-07-23 ENCOUNTER — Ambulatory Visit: Payer: Medicare HMO | Admitting: Pain Medicine

## 2016-08-12 ENCOUNTER — Emergency Department

## 2016-08-12 ENCOUNTER — Encounter: Payer: Self-pay | Admitting: Emergency Medicine

## 2016-08-12 ENCOUNTER — Inpatient Hospital Stay

## 2016-08-12 ENCOUNTER — Inpatient Hospital Stay
Admission: EM | Admit: 2016-08-12 | Discharge: 2016-09-08 | DRG: 871 | Disposition: E | Attending: Internal Medicine | Admitting: Internal Medicine

## 2016-08-12 DIAGNOSIS — E43 Unspecified severe protein-calorie malnutrition: Secondary | ICD-10-CM | POA: Insufficient documentation

## 2016-08-12 DIAGNOSIS — E871 Hypo-osmolality and hyponatremia: Secondary | ICD-10-CM | POA: Diagnosis present

## 2016-08-12 DIAGNOSIS — I959 Hypotension, unspecified: Secondary | ICD-10-CM | POA: Diagnosis not present

## 2016-08-12 DIAGNOSIS — J9601 Acute respiratory failure with hypoxia: Secondary | ICD-10-CM | POA: Diagnosis not present

## 2016-08-12 DIAGNOSIS — R0602 Shortness of breath: Secondary | ICD-10-CM | POA: Diagnosis present

## 2016-08-12 DIAGNOSIS — J9600 Acute respiratory failure, unspecified whether with hypoxia or hypercapnia: Secondary | ICD-10-CM

## 2016-08-12 DIAGNOSIS — J9621 Acute and chronic respiratory failure with hypoxia: Secondary | ICD-10-CM | POA: Diagnosis present

## 2016-08-12 DIAGNOSIS — I129 Hypertensive chronic kidney disease with stage 1 through stage 4 chronic kidney disease, or unspecified chronic kidney disease: Secondary | ICD-10-CM | POA: Diagnosis present

## 2016-08-12 DIAGNOSIS — R918 Other nonspecific abnormal finding of lung field: Secondary | ICD-10-CM

## 2016-08-12 DIAGNOSIS — D649 Anemia, unspecified: Secondary | ICD-10-CM | POA: Diagnosis present

## 2016-08-12 DIAGNOSIS — Z809 Family history of malignant neoplasm, unspecified: Secondary | ICD-10-CM

## 2016-08-12 DIAGNOSIS — B182 Chronic viral hepatitis C: Secondary | ICD-10-CM | POA: Diagnosis present

## 2016-08-12 DIAGNOSIS — M797 Fibromyalgia: Secondary | ICD-10-CM | POA: Diagnosis present

## 2016-08-12 DIAGNOSIS — J189 Pneumonia, unspecified organism: Secondary | ICD-10-CM | POA: Diagnosis present

## 2016-08-12 DIAGNOSIS — B1920 Unspecified viral hepatitis C without hepatic coma: Secondary | ICD-10-CM | POA: Diagnosis present

## 2016-08-12 DIAGNOSIS — Z90710 Acquired absence of both cervix and uterus: Secondary | ICD-10-CM

## 2016-08-12 DIAGNOSIS — M5416 Radiculopathy, lumbar region: Secondary | ICD-10-CM | POA: Diagnosis present

## 2016-08-12 DIAGNOSIS — K7460 Unspecified cirrhosis of liver: Secondary | ICD-10-CM | POA: Diagnosis present

## 2016-08-12 DIAGNOSIS — A419 Sepsis, unspecified organism: Secondary | ICD-10-CM | POA: Diagnosis present

## 2016-08-12 DIAGNOSIS — Z66 Do not resuscitate: Secondary | ICD-10-CM | POA: Diagnosis not present

## 2016-08-12 DIAGNOSIS — Z7189 Other specified counseling: Secondary | ICD-10-CM | POA: Diagnosis not present

## 2016-08-12 DIAGNOSIS — Z9889 Other specified postprocedural states: Secondary | ICD-10-CM

## 2016-08-12 DIAGNOSIS — Z85820 Personal history of malignant melanoma of skin: Secondary | ICD-10-CM

## 2016-08-12 DIAGNOSIS — J9811 Atelectasis: Secondary | ICD-10-CM | POA: Diagnosis present

## 2016-08-12 DIAGNOSIS — R6521 Severe sepsis with septic shock: Secondary | ICD-10-CM | POA: Diagnosis present

## 2016-08-12 DIAGNOSIS — E876 Hypokalemia: Secondary | ICD-10-CM | POA: Diagnosis present

## 2016-08-12 DIAGNOSIS — Z9081 Acquired absence of spleen: Secondary | ICD-10-CM

## 2016-08-12 DIAGNOSIS — J90 Pleural effusion, not elsewhere classified: Secondary | ICD-10-CM | POA: Diagnosis present

## 2016-08-12 DIAGNOSIS — E8809 Other disorders of plasma-protein metabolism, not elsewhere classified: Secondary | ICD-10-CM | POA: Diagnosis present

## 2016-08-12 DIAGNOSIS — R64 Cachexia: Secondary | ICD-10-CM | POA: Diagnosis present

## 2016-08-12 DIAGNOSIS — G894 Chronic pain syndrome: Secondary | ICD-10-CM | POA: Diagnosis present

## 2016-08-12 DIAGNOSIS — C541 Malignant neoplasm of endometrium: Secondary | ICD-10-CM | POA: Diagnosis present

## 2016-08-12 DIAGNOSIS — N183 Chronic kidney disease, stage 3 (moderate): Secondary | ICD-10-CM | POA: Diagnosis present

## 2016-08-12 DIAGNOSIS — Z87891 Personal history of nicotine dependence: Secondary | ICD-10-CM

## 2016-08-12 DIAGNOSIS — C801 Malignant (primary) neoplasm, unspecified: Secondary | ICD-10-CM | POA: Diagnosis not present

## 2016-08-12 DIAGNOSIS — Z6822 Body mass index (BMI) 22.0-22.9, adult: Secondary | ICD-10-CM

## 2016-08-12 DIAGNOSIS — R0600 Dyspnea, unspecified: Secondary | ICD-10-CM

## 2016-08-12 DIAGNOSIS — Z8249 Family history of ischemic heart disease and other diseases of the circulatory system: Secondary | ICD-10-CM

## 2016-08-12 DIAGNOSIS — Z9981 Dependence on supplemental oxygen: Secondary | ICD-10-CM | POA: Diagnosis not present

## 2016-08-12 DIAGNOSIS — Z515 Encounter for palliative care: Secondary | ICD-10-CM | POA: Diagnosis not present

## 2016-08-12 DIAGNOSIS — E872 Acidosis: Secondary | ICD-10-CM | POA: Diagnosis present

## 2016-08-12 DIAGNOSIS — D509 Iron deficiency anemia, unspecified: Secondary | ICD-10-CM | POA: Diagnosis present

## 2016-08-12 DIAGNOSIS — E559 Vitamin D deficiency, unspecified: Secondary | ICD-10-CM | POA: Diagnosis present

## 2016-08-12 DIAGNOSIS — Y95 Nosocomial condition: Secondary | ICD-10-CM | POA: Diagnosis present

## 2016-08-12 DIAGNOSIS — J9690 Respiratory failure, unspecified, unspecified whether with hypoxia or hypercapnia: Secondary | ICD-10-CM

## 2016-08-12 LAB — CBC WITH DIFFERENTIAL/PLATELET
Basophils Absolute: 0 10*3/uL (ref 0–0.1)
Basophils Relative: 0 %
EOS PCT: 0 %
Eosinophils Absolute: 0 10*3/uL (ref 0–0.7)
HEMATOCRIT: 30.8 % — AB (ref 35.0–47.0)
Hemoglobin: 10.4 g/dL — ABNORMAL LOW (ref 12.0–16.0)
LYMPHS ABS: 0.9 10*3/uL — AB (ref 1.0–3.6)
LYMPHS PCT: 9 %
MCH: 33.4 pg (ref 26.0–34.0)
MCHC: 33.7 g/dL (ref 32.0–36.0)
MCV: 99 fL (ref 80.0–100.0)
Monocytes Absolute: 0.5 10*3/uL (ref 0.2–0.9)
Monocytes Relative: 5 %
Neutro Abs: 8.9 10*3/uL — ABNORMAL HIGH (ref 1.4–6.5)
Neutrophils Relative %: 86 %
PLATELETS: 222 10*3/uL (ref 150–440)
RBC: 3.11 MIL/uL — ABNORMAL LOW (ref 3.80–5.20)
RDW: 19.3 % — ABNORMAL HIGH (ref 11.5–14.5)
WBC: 10.4 10*3/uL (ref 3.6–11.0)

## 2016-08-12 LAB — COMPREHENSIVE METABOLIC PANEL
ALK PHOS: 73 U/L (ref 38–126)
ALT: 9 U/L — ABNORMAL LOW (ref 14–54)
AST: 25 U/L (ref 15–41)
Albumin: 1.8 g/dL — ABNORMAL LOW (ref 3.5–5.0)
Anion gap: 8 (ref 5–15)
BILIRUBIN TOTAL: 1.5 mg/dL — AB (ref 0.3–1.2)
BUN: 35 mg/dL — ABNORMAL HIGH (ref 6–20)
CALCIUM: 7.4 mg/dL — AB (ref 8.9–10.3)
CHLORIDE: 102 mmol/L (ref 101–111)
CO2: 24 mmol/L (ref 22–32)
CREATININE: 0.99 mg/dL (ref 0.44–1.00)
GFR, EST NON AFRICAN AMERICAN: 59 mL/min — AB (ref >60)
Glucose, Bld: 163 mg/dL — ABNORMAL HIGH (ref 65–99)
Potassium: 3.6 mmol/L (ref 3.5–5.1)
Sodium: 134 mmol/L — ABNORMAL LOW (ref 135–145)
TOTAL PROTEIN: 5.2 g/dL — AB (ref 6.5–8.1)

## 2016-08-12 LAB — BODY FLUID CELL COUNT WITH DIFFERENTIAL
Eos, Fluid: 0 %
LYMPHS FL: 31 %
Monocyte-Macrophage-Serous Fluid: 14 %
NEUTROPHIL FLUID: 55 %
OTHER CELLS FL: 0 %
Total Nucleated Cell Count, Fluid: 87 cu mm

## 2016-08-12 LAB — TROPONIN I

## 2016-08-12 LAB — LACTIC ACID, PLASMA: Lactic Acid, Venous: 2.8 mmol/L (ref 0.5–1.9)

## 2016-08-12 MED ORDER — SODIUM CHLORIDE 0.9 % IV BOLUS (SEPSIS)
250.0000 mL | Freq: Once | INTRAVENOUS | Status: AC
  Administered 2016-08-13: 250 mL via INTRAVENOUS

## 2016-08-12 MED ORDER — SODIUM CHLORIDE 0.9 % IV SOLN
1000.0000 mL | Freq: Once | INTRAVENOUS | Status: AC
  Administered 2016-08-12: 1000 mL via INTRAVENOUS

## 2016-08-12 MED ORDER — POLYETHYLENE GLYCOL 3350 17 G PO PACK
17.0000 g | PACK | Freq: Every day | ORAL | Status: DC | PRN
Start: 2016-08-12 — End: 2016-08-14

## 2016-08-12 MED ORDER — SODIUM CHLORIDE 0.9 % IV BOLUS (SEPSIS)
250.0000 mL | Freq: Once | INTRAVENOUS | Status: AC
  Administered 2016-08-12: 250 mL via INTRAVENOUS

## 2016-08-12 MED ORDER — PSYLLIUM 95 % PO PACK
1.0000 | PACK | Freq: Every day | ORAL | Status: DC
  Administered 2016-08-13 – 2016-08-15 (×3): 1 via ORAL
  Filled 2016-08-12 (×3): qty 1

## 2016-08-12 MED ORDER — MORPHINE SULFATE ER 15 MG PO TBCR: 15.0000 mg | EXTENDED_RELEASE_TABLET | Freq: Two times a day (BID) | ORAL | Status: DC

## 2016-08-12 MED ORDER — ACETAMINOPHEN 650 MG RE SUPP: 650.0000 mg | Freq: Four times a day (QID) | RECTAL | Status: DC | PRN

## 2016-08-12 MED ORDER — PIPERACILLIN-TAZOBACTAM 3.375 G IVPB
3.3750 g | Freq: Once | INTRAVENOUS | Status: AC
  Administered 2016-08-12: 3.375 g via INTRAVENOUS
  Filled 2016-08-12: qty 50

## 2016-08-12 MED ORDER — HYDROCODONE-ACETAMINOPHEN 5-325 MG PO TABS
1.0000 | ORAL_TABLET | ORAL | Status: DC | PRN
  Administered 2016-08-12: 1 via ORAL
  Administered 2016-08-13: 2 via ORAL
  Filled 2016-08-12: qty 1
  Filled 2016-08-12: qty 2

## 2016-08-12 MED ORDER — ALBUTEROL SULFATE (2.5 MG/3ML) 0.083% IN NEBU: 2.5000 mg | INHALATION_SOLUTION | RESPIRATORY_TRACT | Status: DC | PRN

## 2016-08-12 MED ORDER — PIPERACILLIN-TAZOBACTAM 3.375 G IVPB
3.3750 g | Freq: Three times a day (TID) | INTRAVENOUS | Status: DC
  Administered 2016-08-13 – 2016-08-15 (×9): 3.375 g via INTRAVENOUS
  Filled 2016-08-12 (×9): qty 50

## 2016-08-12 MED ORDER — HYDROMORPHONE HCL 1 MG/ML IJ SOLN
1.0000 mg | Freq: Once | INTRAMUSCULAR | Status: AC
  Administered 2016-08-12: 1 mg via INTRAVENOUS
  Filled 2016-08-12: qty 1

## 2016-08-12 MED ORDER — VANCOMYCIN HCL IN DEXTROSE 750-5 MG/150ML-% IV SOLN
750.0000 mg | INTRAVENOUS | Status: DC
  Administered 2016-08-13 (×2): 750 mg via INTRAVENOUS
  Filled 2016-08-12 (×6): qty 150

## 2016-08-12 MED ORDER — SODIUM CHLORIDE 0.9 % IV SOLN
INTRAVENOUS | Status: DC
  Administered 2016-08-12 – 2016-08-13 (×3): via INTRAVENOUS

## 2016-08-12 MED ORDER — PENTAFLUOROPROP-TETRAFLUOROETH EX AERO
INHALATION_SPRAY | CUTANEOUS | Status: AC
  Filled 2016-08-12: qty 30

## 2016-08-12 MED ORDER — LORAZEPAM 1 MG PO TABS
0.5000 mg | ORAL_TABLET | ORAL | Status: DC | PRN
  Administered 2016-08-14 – 2016-08-15 (×2): 0.5 mg via ORAL
  Filled 2016-08-12 (×2): qty 1

## 2016-08-12 MED ORDER — VANCOMYCIN HCL IN DEXTROSE 1-5 GM/200ML-% IV SOLN
1000.0000 mg | Freq: Once | INTRAVENOUS | Status: AC
  Administered 2016-08-12: 1000 mg via INTRAVENOUS
  Filled 2016-08-12: qty 200

## 2016-08-12 MED ORDER — ENOXAPARIN SODIUM 40 MG/0.4ML ~~LOC~~ SOLN
40.0000 mg | SUBCUTANEOUS | Status: DC
Start: 2016-08-12 — End: 2016-08-15
  Filled 2016-08-12 (×2): qty 0.4

## 2016-08-12 MED ORDER — GABAPENTIN 300 MG PO CAPS
300.0000 mg | ORAL_CAPSULE | Freq: Three times a day (TID) | ORAL | Status: DC
Start: 2016-08-12 — End: 2016-08-15
  Administered 2016-08-12 – 2016-08-15 (×8): 300 mg via ORAL
  Filled 2016-08-12 (×9): qty 1

## 2016-08-12 MED ORDER — IOPAMIDOL (ISOVUE-370) INJECTION 76%: 75.0000 mL | Freq: Once | INTRAVENOUS | Status: DC | PRN

## 2016-08-12 MED ORDER — ACETAMINOPHEN 325 MG PO TABS: 650.0000 mg | ORAL_TABLET | Freq: Four times a day (QID) | ORAL | Status: DC | PRN

## 2016-08-12 MED ORDER — SODIUM CHLORIDE 0.9 % IV BOLUS (SEPSIS)
250.0000 mL | Freq: Once | INTRAVENOUS | Status: AC
  Administered 2016-08-12: 22:00:00 250 mL via INTRAVENOUS

## 2016-08-12 MED ORDER — ONDANSETRON HCL 4 MG PO TABS: 4.0000 mg | ORAL_TABLET | Freq: Four times a day (QID) | ORAL | Status: DC | PRN

## 2016-08-12 MED ORDER — DOCUSATE SODIUM 100 MG PO CAPS
100.0000 mg | ORAL_CAPSULE | Freq: Two times a day (BID) | ORAL | Status: DC
  Administered 2016-08-12 – 2016-08-15 (×6): 100 mg via ORAL
  Filled 2016-08-12 (×7): qty 1

## 2016-08-12 MED ORDER — PANTOPRAZOLE SODIUM 40 MG PO TBEC
40.0000 mg | DELAYED_RELEASE_TABLET | Freq: Every day | ORAL | Status: DC
  Administered 2016-08-13 – 2016-08-15 (×3): 40 mg via ORAL
  Filled 2016-08-12 (×3): qty 1

## 2016-08-12 MED ORDER — MORPHINE SULFATE ER 15 MG PO TBCR
15.0000 mg | EXTENDED_RELEASE_TABLET | Freq: Every morning | ORAL | Status: DC
  Administered 2016-08-13 – 2016-08-15 (×3): 15 mg via ORAL
  Filled 2016-08-12 (×5): qty 1

## 2016-08-12 MED ORDER — MORPHINE SULFATE ER 30 MG PO TBCR
30.0000 mg | EXTENDED_RELEASE_TABLET | Freq: Every day | ORAL | Status: DC
  Administered 2016-08-13 – 2016-08-14 (×2): 30 mg via ORAL
  Filled 2016-08-12 (×2): qty 1

## 2016-08-12 MED ORDER — MIDODRINE HCL 5 MG PO TABS
2.5000 mg | ORAL_TABLET | Freq: Once | ORAL | Status: AC
  Administered 2016-08-12: 2.5 mg via ORAL
  Filled 2016-08-12: qty 1

## 2016-08-12 MED ORDER — ONDANSETRON HCL 4 MG/2ML IJ SOLN: 4.0000 mg | Freq: Four times a day (QID) | INTRAMUSCULAR | Status: DC | PRN

## 2016-08-12 MED ORDER — LEVOFLOXACIN IN D5W 750 MG/150ML IV SOLN
750.0000 mg | Freq: Once | INTRAVENOUS | Status: AC
  Administered 2016-08-12: 750 mg via INTRAVENOUS
  Filled 2016-08-12: qty 150

## 2016-08-12 MED ORDER — SODIUM CHLORIDE 0.9 % IV BOLUS (SEPSIS)
500.0000 mL | Freq: Once | INTRAVENOUS | Status: AC
  Administered 2016-08-12: 500 mL via INTRAVENOUS

## 2016-08-12 NOTE — Progress Notes (Signed)
Patient is refusing blood cultures to be drawn at this time. Patient educated; however, continues to refuse. Madlyn Frankel, RN

## 2016-08-12 NOTE — ED Notes (Signed)
Discussed lack of output with Dr. Darvin Neighbours, MD aware and acknowledged.

## 2016-08-12 NOTE — H&P (Signed)
[facility] - [place] at [facility]   PATIENT NAME: Elizabeth Monroe    MR#:  [id number]  DATE OF BIRTH:  [birth year]  DATE OF ADMISSION:  08/14/2016  PRIMARY CARE PHYSICIAN: No PCP Per Patient   REQUESTING/REFERRING PHYSICIAN: Dr. Mayford Knife  CHIEF COMPLAINT:   Chief Complaint  Patient presents with  . Shortness of Breath  . Sore Throat    HISTORY OF PRESENT ILLNESS:  Elizabeth Monroe  is a 66 y.o. female with a known history of Stage IV endometrial cancer not on treatment, cirrhosis, malnutrition presents to the emergency room complaining of worsening shortness of breath and sore throat. Here she has been found to have bilateral pleural effusions with right greater than left on the CT chest with partial lung collapse and is being admitted to the hospitalist service. She follows at [facility] for her endometrial cancer but has not been on treatment for many months. She is on 2 L oxygen at baseline. On Lasix and Aldactone at home. Here she has low blood pressure into systolic of 80s with tachycardia. Has received 1 dose of Zosyn and vancomycin in the emergency room with blood cultures drawn.  PAST MEDICAL HISTORY:   Past Medical History:  Diagnosis Date  . Anxiety   . Arthritis   . Bleeding gastrointestinal 04/26/2013  . Cancer (H[facility])   . Cardiac arrhythmia   . Cirrhosis (H[facility])   . 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 Depression   . Fibromyalgia   . Hepatitis   . Hypertension   . Leg pain 06/14/2015  . Leg swelling 04/04/2015  . Lumbar radiculopathy 09/17/2015  . Malignant neoplasm of endometrium (H[facility]) 10/14/2013    PAST SURGICAL HISTORY:   Past Surgical History:  Procedure Laterality Date  . ABDOMINAL HYSTERECTOMY    . BRAIN SURGERY    . MELANOMA EXCISION    . SPLENECTOMY, TOTAL      SOCIAL HISTORY:   Social History  Substance Use Topics  . Smoking status: Former Games developer  . Smokeless tobacco: Never Used  . Alcohol use No    FAMILY HISTORY:    Family History  Problem Relation Age of Onset  . Cancer Mother   . Heart disease Father   . Hypertension Father     DRUG ALLERGIES:  No Known Allergies  REVIEW OF SYSTEMS:   Review of Systems  Constitutional: Positive for malaise/fatigue. Negative for chills and fever.  HENT: Negative for sore throat.   Eyes: Negative for blurred vision, double vision and pain.  Respiratory: Positive for cough and shortness of breath. Negative for hemoptysis and wheezing.   Cardiovascular: Positive for orthopnea. Negative for chest pain, palpitations and leg swelling.  Gastrointestinal: Negative for abdominal pain, constipation, diarrhea, heartburn, nausea and vomiting.  Genitourinary: Negative for dysuria and hematuria.  Musculoskeletal: Positive for back pain and myalgias. Negative for joint pain.  Skin: Negative for rash.  Neurological: Positive for dizziness and weakness. Negative for sensory change, speech change, focal weakness and headaches.  Endo/Heme/Allergies: Bruises/bleeds easily.  Psychiatric/Behavioral: Positive for depression. The patient is nervous/anxious.     MEDICATIONS AT HOME:   Prior to Admission medications   Medication Sig Start Date End Date Taking? Authorizing Provider  docusate sodium (COLACE) 100 MG capsule Take 200 mg by mouth daily as needed for mild constipation.   Yes Historical Provider, MD  furosemide (LASIX) 40 MG tablet Take 40 mg by mouth daily.  03/05/16  Yes Historical Provider, MD  gabapentin (NEURONTIN) 300  MG capsule Take 1 capsule (300 mg total) by mouth 3 (three) times daily. 12/27/15  Yes Milinda Pointer, MD  lactulose (CHRONULAC) 10 GM/15ML solution Take 10 g by mouth daily as needed.  01/27/16  Yes Historical Provider, MD  lidocaine (LIDODERM) 5 % Place 1 patch onto the skin every 12 (twelve) hours. Remove & Discard patch within 12 hours or as directed by MD   Yes Historical Provider, MD  LORazepam (ATIVAN) 0.5 MG tablet Take 0.5 mg by mouth every  2 (two) hours as needed for anxiety.   Yes Historical Provider, MD  morphine (MS CONTIN) 30 MG 12 hr tablet '15mg'$  PO in the morning, '30mg'$  PO in the evening. 05/14/16  Yes Anne-Caroline Mariea Clonts, MD  ondansetron (ZOFRAN) 4 MG tablet Take 1 tablet (4 mg total) by mouth every 8 (eight) hours as needed for nausea or vomiting. 12/27/15  Yes Milinda Pointer, MD  oxyCODONE (OXY IR/ROXICODONE) 5 MG immediate release tablet Take 5 mg by mouth every 6 (six) hours as needed for severe pain.   Yes Historical Provider, MD  pantoprazole (PROTONIX) 40 MG tablet Take 40 mg by mouth daily.    Yes Historical Provider, MD  prochlorperazine (COMPAZINE) 10 MG tablet Take 10 mg by mouth daily as needed.  04/16/16  Yes Historical Provider, MD  Alcohol Swabs (ALCOHOL PREP) PADS  06/27/15   Historical Provider, MD  Blood Glucose Calibration (TRUE METRIX LEVEL 1) Low SOLN  03/20/16   Historical Provider, MD  Blood Glucose Monitoring Suppl (TRUE METRIX AIR GLUCOSE METER) w/Device KIT  03/20/16   Historical Provider, MD  Cholecalciferol (VITAMIN D3) 2000 units capsule Take 1 capsule (2,000 Units total) by mouth daily. 09/06/15   Milinda Pointer, MD  losartan (COZAAR) 25 MG tablet Take 25 mg by mouth daily.    Historical Provider, MD  Melatonin 3 MG TABS Take 3 mg by mouth at bedtime.     Historical Provider, MD  Naloxone HCl (NARCAN) 4 MG/0.1ML LIQD Place 1 Bottle into the nose once. , then call 911, repeat if needed in other nostril with new bottle. 09/06/15   Milinda Pointer, MD  Oxycodone HCl 10 MG TABS Take 1 tablet (10 mg total) by mouth 5 (five) times daily as needed. 04/23/16 05/23/16  Milinda Pointer, MD  Oxycodone HCl 10 MG TABS Take 1 tablet (10 mg total) by mouth 5 (five) times daily as needed. 05/23/16 06/22/16  Milinda Pointer, MD  Oxycodone HCl 10 MG TABS Take 1 tablet (10 mg total) by mouth 5 (five) times daily as needed. 06/22/16 07/22/16  Milinda Pointer, MD  propranolol (INDERAL) 10 MG tablet Take 10 mg by  mouth 2 (two) times daily.    Historical Provider, MD  psyllium (METAMUCIL) 58.6 % packet Take 1 packet by mouth daily.    Historical Provider, MD  spironolactone (ALDACTONE) 100 MG tablet Take 100 mg by mouth daily.  08/07/15 03/01/16  Historical Provider, MD  spironolactone (ALDACTONE) 100 MG tablet  03/05/16   Historical Provider, MD  TRUE METRIX BLOOD GLUCOSE TEST test strip  03/20/16   Historical Provider, MD  TRUEPLUS LANCETS 28G [place]  03/20/16   Historical Provider, MD     VITAL SIGNS:  Blood pressure 98/70, pulse 82, temperature 97.4 F (36.3 C), temperature source Oral, resp. rate 18, height '5\' 4"'$  (1.626 m), weight 59 kg (130 lb), SpO2 99 %.  PHYSICAL EXAMINATION:  Physical Exam  GENERAL:  66 y.o.-year-old patient lying in the bed with no acute distress. Frail EYES:  Pupils equal, round, reactive to light and accommodation. No scleral icterus. Extraocular muscles intact.  HEENT: Head atraumatic, normocephalic. Oropharynx and nasopharynx clear. No oropharyngeal erythema, moist oral mucosa  NECK:  Supple, no jugular venous distention. No thyroid enlargement, no tenderness.  LUNGS: Decreased breath sounds bilateral bases. Increased work of breathing. CARDIOVASCULAR: S1, S2 normal. No murmurs, rubs, or gallops.  ABDOMEN: Soft, nontender, nondistended. Bowel sounds present. No organomegaly or mass.  EXTREMITIES: No cyanosis, or clubbing. + 2 pedal & radial pulses b/l.  Bilateral lower extremity edema NEUROLOGIC: Cranial nerves II through XII are intact. No focal Motor or sensory deficits appreciated b/l PSYCHIATRIC: The patient is alert and oriented x 3. Good affect.  SKIN: No obvious rash, lesion, or ulcer. Bruising on the arms.  LABORATORY PANEL:   CBC  Recent Labs Lab 08/30/2016 1326  WBC 10.4  HGB 10.4*  HCT 30.8*  PLT 222   ------------------------------------------------------------------------------------------------------------------  Chemistries   Recent Labs Lab  08/29/2016 1326  NA 134*  K 3.6  CL 102  CO2 24  GLUCOSE 163*  BUN 35*  CREATININE 0.99  CALCIUM 7.4*  AST 25  ALT 9*  ALKPHOS 73  BILITOT 1.5*   ------------------------------------------------------------------------------------------------------------------  Cardiac Enzymes  Recent Labs Lab 08/18/2016 1326  TROPONINI <0.03   ------------------------------------------------------------------------------------------------------------------  RADIOLOGY:  Dg Chest 2 View  Result Date: 08/16/2016 CLINICAL DATA:  Respiratory symptoms for 2-3 days. Patient being treated with chemotherapy for endometrial carcinoma. EXAM: CHEST  2 VIEW COMPARISON:  December 27, 2013 FINDINGS: A right Port-A-Cath terminates in good position. There is a moderate right-sided pleural effusion with underlying opacity. A smaller left effusion is identified. Patchy opacity is seen in the left mid lung with nodular components. One of the apparent nodular components is lucent centrally suggesting the possibility of central cavitation. No other interval changes or acute abnormalities. IMPRESSION: 1. Patchy nodular opacities in the left mid lung. One of these nodular opacities demonstrates central lucency suggesting the possibility of cavitation. The findings could represent an infectious or neoplastic process given history. Recommend clinical correlation and follow-up to resolution. 2. Moderate right and small left effusions with underlying opacities. Electronically Signed   By: Dorise Bullion III M.D   On: 09/05/2016 14:29   Ct Chest Wo Contrast  Result Date: 08/17/2016 CLINICAL DATA:  Respiratory symptoms for 2 or 3 days. Started with sore throat, now shortness of breath. Being treated with chemotherapy for metastatic endometrial cancer. EXAM: CT CHEST WITHOUT CONTRAST TECHNIQUE: Multidetector CT imaging of the chest was performed following the standard protocol without IV contrast. COMPARISON:  Chest radiographs today.  Abdominal CT 06/19/2010. No previous chest CT. FINDINGS: Cardiovascular: Mild aortic and coronary artery atherosclerosis. No acute vascular findings are demonstrated on noncontrast imaging. There is a right IJ central venous catheter with its tip in the mid SVC. The heart size is normal. There may be a small amount of pericardial fluid anteriorly. Mediastinum/Nodes: The mediastinum is shifted into the left hemithorax. No enlarged mediastinal, hilar or axillary lymph nodes are seen, although hilar evaluation is limited by the lack of intravenous contrast. The thyroid gland, trachea and esophagus demonstrate no significant findings. Lungs/Pleura: There is a very large pleural effusion on the right with resulting mediastinal shift to the left. There is a small dependent left pleural effusion. The right lower lobe is completely collapsed. There is dependent atelectasis in the right upper and middle lobes. There are patchy perihilar airspace opacities throughout the left lung, greatest in the upper lobe. No  discrete pulmonary nodules are seen. Upper abdomen: The liver is shrunken with diffuse contour irregularity, consistent with advanced cirrhosis. Grossly stable lobularity of the spleen status post previous embolization procedure. Musculoskeletal/Chest wall: There is no chest wall mass or suspicious osseous finding. There are scattered small sclerotic lesions in the spine which are grossly stable. IMPRESSION: 1. Large right and small left pleural effusions. The right pleural effusion results in partial right lung collapse and mediastinal shift to the left. Right-sided thoracentesis or chest tube placement should be considered for diagnostic and therapeutic purposes. 2. Patchy airspace opacities throughout the aerated left lung, greatest in the upper lobe, suspicious for pneumonia. 3. Morphologic changes of hepatic cirrhosis. Electronically Signed   By: Richardean Sale M.D.   On: 08/09/2016 15:23     IMPRESSION AND  PLAN:   * Sepsis with Pleural effusions R>>L Start vanc and zosyn Right thoracentesis for therapeutic and diagnostic studies. Blood cultures sent from emergency room. Consult pulmonary. Further antibiotic changes as per culture results. Check lactic acid Check urinalysis.  * Stage IV endometrial cancer not on treatment Consult palliative care for goals of care discussion.  * Cirrhosis Hold Aldactone and Lasix due to hypotension.  * Severe protein calorie malnutrition. Dietary consult.  * DVT prophylaxis with Lovenox  All the records are reviewed and case discussed with ED provider. Management plans discussed with the patient, family and they are in agreement.  CODE STATUS: FULL CODE  TOTAL TIME TAKING CARE OF THIS PATIENT: 40 minutes.   Hillary Bow R M.D on 08/19/2016 at 4:13 PM  Between 7am to 6pm - Pager - [phone]  After 6pm go to [email or web address] - password [email or web address] [place] [facility]  Office  [phone]  CC: Primary care physician; No PCP Per Patient  Note: This dictation was prepared with Dragon dictation along with smaller phrase technology. Any transcriptional errors that result from this process are unintentional.

## 2016-08-12 NOTE — Care Management Note (Signed)
Case Management Note  Patient Details  Name: Elizabeth Monroe MRN: [id number] Date of Birth: 02-07-1951  Subjective/Objective:  Information from [facility] with [facility] , that they offer services to this lady at [facility] , and she was to go to [facility] today.                  Action/Plan:   Expected Discharge Date:                  Expected Discharge Plan:     In-House Referral:     Discharge planning Services     Post Acute Care Choice:    Choice offered to:     DME Arranged:    DME Agency:     HH Arranged:    HH Agency:     Status of Service:     If discussed at [employer] of Stay Meetings, dates discussed:    Additional Comments:  Beau Fanny, RN 08/14/2016, 1:19 PM

## 2016-08-12 NOTE — ED Triage Notes (Signed)
brouoght by ems from [facility] for resp sx for 2-3 days.  Started with sore throat and now sob.  Pt is alert, pale and oriented.  Has port a cath right upper chest and has a nephrostomy tube on the left side.  Also, they gave her 40 mg lasix about 4 hr ago

## 2016-08-12 NOTE — Progress Notes (Signed)
ED visit made. Patient is currently followed by [facility] [place] at [facility] with a hospice diagnosis of uterine cancer. She is a FULL CODE. Code status has been addressed with patient as recently as last Thursday, at that time she requested that her grandson Alphia Kava [phone]) make decisions regarding her care. Patient was to have transferred from [facility] to [facility] today. She was sent to the [facility] ED today for evaluation of changes noted by her hospice RN since her last visit, Friday 3/2. She found patient to be short of breath with oxygen sats of 83% on room air , tachycardic, and auscultated crackles in her lung fields as well as increased edema to left arm and bilateral lower extremities.  Patient seen lying on the ED stretcher, alert and interactive, reports she feels better. Oxygen in place at 2 liters, sats 97-100%. Writer attempted to assist patient with her phone as she was receiving a call, call missed. Writer has spoken to [place] and ED physician Dr. Jimmye Norman top advise patient's family contact information and plan to transfer to [facility]. Patient did confirm again her desire to go to [facility]. Plan at this time is for a Chest CT as a follow up to findings on the chest xray. Dr. Jimmye Norman to contact patient's grandson after results are known. Writer will continue to follow and update hospice team. Thank you. Flo Shanks RN, BSN, [facility] [place], hospital Liaison [phone] c

## 2016-08-12 NOTE — ED Notes (Signed)
Patient's pastor in room with patient.  Will continue to monitor.

## 2016-08-12 NOTE — ED Notes (Signed)
Admitting MD in room at this time 

## 2016-08-12 NOTE — Progress Notes (Signed)
LCSW received call from [place] regarding patient status in ED. Patient is a current patient for [facility].  Patient is from [facility], however was planning to transition to LTC at [facility] today 08/30/2016, however had a decline in medical health and came to ED.  LCSW spoke with [name] who confirms plans and will be updated once more is known whether patient will DC or be admitted.  When disposition is determined. Patient will continue under hospice services at [facility] facility.  Will continue to follow and assist as needed.  If admitted, will alert inpatient staff (CSWs) regarding patient plan.  Lane Hacker, MSW Clinical Social Work: [employer] Coverage for :  [phone]

## 2016-08-12 NOTE — ED Provider Notes (Signed)
[facility] Emergency Department Provider Note        Time seen:----------------------------------------- 12:51 PM on 08/16/2016 -----------------------------------------    I have reviewed the triage vital signs and the nursing notes.   HISTORY  Chief Complaint Shortness of Breath and Sore Throat    HPI Elizabeth Monroe is a 66 y.o. female brought to the ER by EMS from [facility] for respiratory symptoms for the past 2-3 days. Patient started with a sore throat and presents short of breath. Reportedly she had 40 mg of Lasix about 4 hours ago. She denies fevers or chills, reportedly has a history of nephrostomy tube and has a right chest Port-A-Cath.   Past Medical History:  Diagnosis Date  . Anxiety   . Arthritis   . Bleeding gastrointestinal 04/26/2013  . Cancer ([place])   . Cardiac arrhythmia   . Cirrhosis ([place])   . 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 Depression   . Fibromyalgia   . Hepatitis   . Hypertension   . Leg pain 06/14/2015  . Leg swelling 04/04/2015  . Lumbar radiculopathy 09/17/2015  . 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 Chronic pain syndrome 04/23/2016  . Diabetes mellitus ([place]) 04/23/2016  . Drug-induced nausea and vomiting 12/27/2015  . Neurogenic pain 12/27/2015  . Deep vein thrombosis of portal vein 10/24/2015  . 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 Moderate episode of recurrent major depressive disorder ([place]) 09/06/2015  . Liver cirrhosis ([place]) 09/06/2015  . 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 Vitamin D deficiency 07/03/2015  . Malignant melanoma ([place]) 06/14/2015  . Chronic viral hepatitis C ([place]) 06/14/2015  . Chronic kidney disease 06/14/2015  . Long term current use of opiate analgesic 06/14/2015  . Long term prescription opiate use 06/14/2015  . Opiate use (90 MME/Day) 06/14/2015  . Encounter for  therapeutic drug level monitoring 06/14/2015  . Cancer-related pain 06/14/2015  . History of metastatic endometrial cancer (metastases to lymph nodes) 06/14/2015  . Chronic low back pain 06/14/2015  . Chronic neck pain 06/14/2015  . Chronic hip pain 06/14/2015  . Endometrial cancer ([place]) 06/14/2015  . Chronic pain of lower extremity 06/14/2015  . Chronic radicular pain of lower extremity 06/14/2015  . Arthropathy of hip 06/14/2015  . History of uterine cancer 06/14/2015  . Pain of metastatic malignancy 06/14/2015  . Other specified soft tissue disorders 04/04/2015  . 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 Lumbar DDD 04/26/2013  . Bleeding gastrointestinal 04/26/2013  . BP (high blood pressure) 03/01/2013  . Hypertension 03/01/2013  . HCV (hepatitis C virus) 10/20/2012    Past Surgical History:  Procedure Laterality Date  . ABDOMINAL HYSTERECTOMY    . BRAIN SURGERY    . MELANOMA EXCISION    . SPLENECTOMY, TOTAL      Allergies Patient has no known allergies.  Social History Social History  Substance Use Topics  . Smoking status: Former Research scientist (life sciences)  . Smokeless tobacco: Never Used  . Alcohol use No    Review of Systems Constitutional: Negative for fever. ENT: Positive for sore throat Cardiovascular: Negative for chest pain. Respiratory: Positive for shortness of breath Gastrointestinal: Negative for abdominal pain, vomiting and diarrhea. Genitourinary: Negative for dysuria. Musculoskeletal: Negative for back pain. Skin: Negative for rash. Neurological: Negative for headaches, Positive for generalized weakness  10-point ROS otherwise negative.  ____________________________________________  PHYSICAL EXAM:  VITAL SIGNS: ED Triage Vitals  Enc Vitals Group     BP 08/24/2016 1239 (!) 89/73     Pulse Rate 09/01/2016 1239 (!) 112     Resp 09/06/2016 1239 (!) 27     Temp 08/22/2016 1239 97.4 F (36.3 C)     Temp Source  08/14/2016 1239 Oral     SpO2 08/16/2016 1239 99 %     Weight 09/07/2016 1245 130 lb (59 kg)     Height 08/23/2016 1245 5\' 4"  (1.626 m)     Head Circumference --      [facility] --      Pain Score --      Pain Loc --      Pain Edu? --      Excl. in [place]? --     Constitutional: Alert and oriented. Cachectic, no distress Eyes: Conjunctivae are normal. PERRL. Normal extraocular movements. ENT   Head: Normocephalic and atraumatic.   Nose: No congestion/rhinnorhea.   Mouth/Throat: Mucous membranes are dry   Neck: No stridor. Cardiovascular: Normal rate, regular rhythm. No murmurs, rubs, or gallops. Respiratory: Normal respiratory effort without tachypnea nor retractions. Breath sounds are clear and equal bilaterally. No wheezes/rales/rhonchi. Gastrointestinal: Soft and nontender. Normal bowel sounds Musculoskeletal: Limited range of motion of the extremities Neurologic:  Normal speech and language. No gross focal neurologic deficits are appreciated. Generalized weakness, nothing focal Skin:  Skin is warm, dry and intact. No rash noted. Psychiatric: Mood and affect are normal. Speech and behavior are normal.  ____________________________________________  EKG: Interpreted by me.Sinus tachycardia with rate of 109 bpm, low voltage QRS, normal QT, T-wave inversions  ____________________________________________  ED COURSE:  Pertinent labs & imaging results that were available during my care of the patient were reviewed by me and considered in my medical decision making (see chart for details).    Procedures ____________________________________________   LABS (pertinent positives/negatives)  Labs Reviewed  CBC WITH DIFFERENTIAL/PLATELET - Abnormal; Notable for the following:       Result Value   RBC 3.11 (*)    Hemoglobin 10.4 (*)    HCT 30.8 (*)    RDW 19.3 (*)    Neutro Abs 8.9 (*)    Lymphs Abs 0.9 (*)    All other components within normal limits  COMPREHENSIVE METABOLIC  PANEL - Abnormal; Notable for the following:    Sodium 134 (*)    Glucose, Bld 163 (*)    BUN 35 (*)    Calcium 7.4 (*)    Total Protein 5.2 (*)    Albumin 1.8 (*)    ALT 9 (*)    Total Bilirubin 1.5 (*)    GFR calc non Af Amer 59 (*)    All other components within normal limits  TROPONIN I  URINALYSIS, COMPLETE (UACMP) WITH MICROSCOPIC    RADIOLOGY Images were viewed by me  Chest x-ray IMPRESSION:  1. Large right and small left pleural effusions. The right pleural  effusion results in partial right lung collapse and mediastinal  shift to the left. Right-sided thoracentesis or chest tube placement  should be considered for diagnostic and therapeutic purposes.  2. Patchy airspace opacities throughout the aerated left lung,  greatest in the upper lobe, suspicious for pneumonia.  3. Morphologic changes of hepatic cirrhosis.    ____________________________________________  FINAL ASSESSMENT AND PLAN  Dyspnea, weakness, Pleural effusion, possible pneumonia  Plan: Patient with labs and imaging as dictated above. Patient presents to the ER for weakness  of uncertain etiology. She does have evidence of pneumonia and pleural effusions on CT. We will order ultrasound-guided thoracentesis, get broad-spectrum antibiotics and discussed with the hospitalist for admission.   Earleen Newport, MD   Note: This note was generated in part or whole with voice recognition software. Voice recognition is usually quite accurate but there are transcription errors that can and very often do occur. I apologize for any typographical errors that were not detected and corrected.     Earleen Newport, MD 08/13/2016 [phone]

## 2016-08-12 NOTE — ED Notes (Signed)
Attempted 2nd set of blood cultures x 2.  Dr. Darvin Neighbours notified and states we can send only 1 set of blood cultures.

## 2016-08-12 NOTE — ED Notes (Signed)
Family reports that patient is under hospice care.

## 2016-08-12 NOTE — Procedures (Signed)
Pre procedural Dx: Symptomatic Pleural effusion Post procedural Dx: Same  Successful US guided right sided thoracentesis yielding 1.8 L of serous pleural fluid.   Samples sent to lab for analysis.  EBL: None  Complications: None immediate.  Jay Talea Manges, MD Pager #: [phone]   

## 2016-08-12 NOTE — Progress Notes (Signed)
Advance care planning  Discussed with Elizabeth Monroe about her endometrial cancer and prognosis. She tells me she follows at [facility] and has not been on treatment for a longtime and it is worsening. She wants to go back to [facility] to look at options. She wants her HCPOA to be her grandson Mr. Pershing Proud.  She has discussed in the past regarding her code status and wants to be a FULL CODE. Not long term on a ventilator.  Time spent 20 minutes

## 2016-08-12 NOTE — ED Notes (Signed)
Patient transported to thoracentesis.

## 2016-08-13 ENCOUNTER — Inpatient Hospital Stay

## 2016-08-13 DIAGNOSIS — C801 Malignant (primary) neoplasm, unspecified: Secondary | ICD-10-CM

## 2016-08-13 DIAGNOSIS — I959 Hypotension, unspecified: Secondary | ICD-10-CM

## 2016-08-13 DIAGNOSIS — J90 Pleural effusion, not elsewhere classified: Secondary | ICD-10-CM

## 2016-08-13 DIAGNOSIS — A419 Sepsis, unspecified organism: Principal | ICD-10-CM

## 2016-08-13 DIAGNOSIS — Z7189 Other specified counseling: Secondary | ICD-10-CM

## 2016-08-13 DIAGNOSIS — J9601 Acute respiratory failure with hypoxia: Secondary | ICD-10-CM

## 2016-08-13 DIAGNOSIS — Z515 Encounter for palliative care: Secondary | ICD-10-CM

## 2016-08-13 DIAGNOSIS — J189 Pneumonia, unspecified organism: Secondary | ICD-10-CM

## 2016-08-13 LAB — CBC
HEMATOCRIT: 28.7 % — AB (ref 35.0–47.0)
HEMOGLOBIN: 9.6 g/dL — AB (ref 12.0–16.0)
MCH: 33.7 pg (ref 26.0–34.0)
MCHC: 33.6 g/dL (ref 32.0–36.0)
MCV: 100.3 fL — AB (ref 80.0–100.0)
Platelets: 170 10*3/uL (ref 150–440)
RBC: 2.86 MIL/uL — AB (ref 3.80–5.20)
RDW: 19.6 % — ABNORMAL HIGH (ref 11.5–14.5)
WBC: 10.3 10*3/uL (ref 3.6–11.0)

## 2016-08-13 LAB — BASIC METABOLIC PANEL
ANION GAP: 10 (ref 5–15)
BUN: 34 mg/dL — ABNORMAL HIGH (ref 6–20)
CHLORIDE: 104 mmol/L (ref 101–111)
CO2: 22 mmol/L (ref 22–32)
Calcium: 6.8 mg/dL — ABNORMAL LOW (ref 8.9–10.3)
Creatinine, Ser: 1.13 mg/dL — ABNORMAL HIGH (ref 0.44–1.00)
GFR, EST AFRICAN AMERICAN: 58 mL/min — AB (ref >60)
GFR, EST NON AFRICAN AMERICAN: 50 mL/min — AB (ref >60)
Glucose, Bld: 144 mg/dL — ABNORMAL HIGH (ref 65–99)
POTASSIUM: 3.3 mmol/L — AB (ref 3.5–5.1)
SODIUM: 136 mmol/L (ref 135–145)

## 2016-08-13 LAB — BLOOD CULTURE ID PANEL (REFLEXED)
Acinetobacter baumannii: NOT DETECTED
CANDIDA ALBICANS: NOT DETECTED
CANDIDA GLABRATA: NOT DETECTED
CANDIDA TROPICALIS: NOT DETECTED
Candida krusei: NOT DETECTED
Candida parapsilosis: NOT DETECTED
ENTEROBACTER CLOACAE COMPLEX: NOT DETECTED
ENTEROBACTERIACEAE SPECIES: NOT DETECTED
ENTEROCOCCUS SPECIES: NOT DETECTED
Escherichia coli: NOT DETECTED
HAEMOPHILUS INFLUENZAE: NOT DETECTED
KLEBSIELLA PNEUMONIAE: NOT DETECTED
Klebsiella oxytoca: NOT DETECTED
LISTERIA MONOCYTOGENES: NOT DETECTED
METHICILLIN RESISTANCE: DETECTED — AB
NEISSERIA MENINGITIDIS: NOT DETECTED
PROTEUS SPECIES: NOT DETECTED
Pseudomonas aeruginosa: NOT DETECTED
STAPHYLOCOCCUS SPECIES: DETECTED — AB
STREPTOCOCCUS AGALACTIAE: NOT DETECTED
STREPTOCOCCUS SPECIES: NOT DETECTED
Serratia marcescens: NOT DETECTED
Staphylococcus aureus (BCID): NOT DETECTED
Streptococcus pneumoniae: NOT DETECTED
Streptococcus pyogenes: NOT DETECTED

## 2016-08-13 LAB — MRSA PCR SCREENING: MRSA by PCR: NEGATIVE

## 2016-08-13 LAB — MAGNESIUM: Magnesium: 1.5 mg/dL — ABNORMAL LOW (ref 1.7–2.4)

## 2016-08-13 LAB — GLUCOSE, CAPILLARY: GLUCOSE-CAPILLARY: 133 mg/dL — AB (ref 65–99)

## 2016-08-13 LAB — ALBUMIN: Albumin: 1.6 g/dL — ABNORMAL LOW (ref 3.5–5.0)

## 2016-08-13 LAB — PROCALCITONIN: PROCALCITONIN: 0.16 ng/mL

## 2016-08-13 MED ORDER — ALBUMIN HUMAN 25 % IV SOLN
25.0000 g | Freq: Four times a day (QID) | INTRAVENOUS | Status: AC
  Administered 2016-08-13 (×3): 25 g via INTRAVENOUS
  Filled 2016-08-13 (×3): qty 100

## 2016-08-13 MED ORDER — ALBUMIN HUMAN 5 % IV SOLN
25.0000 g | Freq: Four times a day (QID) | INTRAVENOUS | Status: DC
  Filled 2016-08-13 (×2): qty 500

## 2016-08-13 MED ORDER — POTASSIUM CHLORIDE 20 MEQ PO PACK
40.0000 meq | PACK | Freq: Once | ORAL | Status: AC
  Administered 2016-08-13: 40 meq via ORAL
  Filled 2016-08-13: qty 2

## 2016-08-13 MED ORDER — NOREPINEPHRINE BITARTRATE 1 MG/ML IV SOLN
0.0000 ug/min | INTRAVENOUS | Status: DC
  Administered 2016-08-13: 2 ug/min via INTRAVENOUS
  Filled 2016-08-13: qty 4

## 2016-08-13 MED ORDER — SODIUM CHLORIDE 0.9 % IV BOLUS (SEPSIS)
250.0000 mL | Freq: Once | INTRAVENOUS | Status: AC
  Administered 2016-08-13: 250 mL via INTRAVENOUS

## 2016-08-13 MED ORDER — ORAL CARE MOUTH RINSE
15.0000 mL | Freq: Two times a day (BID) | OROMUCOSAL | Status: DC
  Administered 2016-08-13 – 2016-08-15 (×5): 15 mL via OROMUCOSAL

## 2016-08-13 MED ORDER — MAGNESIUM SULFATE 4 GM/100ML IV SOLN
4.0000 g | Freq: Once | INTRAVENOUS | Status: AC
  Administered 2016-08-13: 4 g via INTRAVENOUS
  Filled 2016-08-13: qty 100

## 2016-08-13 MED ORDER — SODIUM CHLORIDE 0.9 % IV BOLUS (SEPSIS)
250.0000 mL | Freq: Once | INTRAVENOUS | Status: DC
  Administered 2016-08-13: 250 mL via INTRAVENOUS

## 2016-08-13 NOTE — Consult Note (Signed)
Name: Elizabeth Monroe MRN: [id number] DOB: 1950/12/12    ADMISSION DATE:  08/08/2016 CONSULTATION DATE:  08/12/2016  REFERRING MD :  Dr. Darvin Neighbours  CHIEF COMPLAINT:  Shortness of Breath and Sore throat  BRIEF PATIENT DESCRIPTION: 66 yo female with acute on chronic hypoxic respiratory failure secondary to bilateral pleural effusions right greater than left and ?HCAP  SIGNIFICANT EVENTS  03/5-Pt admitted to oncology unit with acute hypoxic respiratory failure secondary to bilateral pleural effusions and ?HCAP  03/5-Right sided thoracentesis performed by IR with removal of 1.9L of serous pleural fluid and laboratory samples obtained.  Pulmonology consulted 03/6-Pt transferred to [facility] Unit due to hypotension and potential requirement of vasopressors.  STUDIES:  CT CHEST 03/5>>Large right and small left pleural effusions. The right pleural effusion results in partial right lung collapse and mediastinal shift to the left. Right-sided thoracentesis or chest tube placement should be considered for diagnostic and therapeutic purposes. Patchy airspace opacities throughout the aerated left lung, greatest in the upper lobe, suspicious for pneumonia. Morphologic changes of hepatic cirrhosis.  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  She presented to [facility] ER 03/5 per recommendation of her hospice nurse who noticed upon examination the pt was severely short of breath with complaints of a sore throat, tachycardic, auscultation of crackles throughout lung fields, and O2 sats on RA were 83% onset of shortness of breath and sore  throat was 2-3 days prior to presentation to ER.  She arrived to [facility] ER 03/5 via EMS from [facility] and upon arrival CT of Chest was obtained revealing evidence concerning for pneumonia and bilateral pleural effusions right greater than left she was subsequently admitted to the Oncology Unit for further evaluation and treatment.  She underwent a right sided thoracentesis performed by IR with removal of 1.9 L of pleural fluid.  On 03/6 she was transferred to the [facility] with hypotension despite fluid resuscitation and the potential need for vasopressors.  PAST MEDICAL HISTORY :   has a past medical history of Anxiety; Arthritis; Bleeding gastrointestinal (04/26/2013); Cancer ([place]); Cardiac arrhythmia; Cirrhosis ([place]); Deep vein thrombosis of portal vein (01/19/2015); Depression; Fibromyalgia; Hepatitis; Hypertension; Leg pain (06/14/2015); Leg swelling (04/04/2015); Lumbar radiculopathy (09/17/2015); and Malignant neoplasm of endometrium ([place]) (10/14/2013).  has a past surgical history that includes Abdominal hysterectomy; Brain surgery; Splenectomy, total; and Melanoma excision. Prior to Admission medications   Medication Sig Start Date End Date Taking? Authorizing Provider  docusate sodium (COLACE) 100 MG capsule Take 200 mg by mouth daily as needed for mild constipation.   Yes Historical Provider, MD  furosemide (LASIX) 40 MG tablet Take 40 mg by mouth daily.  03/05/16  Yes Historical Provider, MD  gabapentin (NEURONTIN) 300 MG capsule Take 1 capsule (300 mg total) by mouth 3 (three) times daily. 12/27/15  Yes Milinda Pointer, MD  lactulose (CHRONULAC) 10 GM/15ML solution Take 10 g by mouth daily as needed.  01/27/16  Yes Historical Provider, MD  lidocaine (LIDODERM) 5 % Place 1 patch onto the skin every 12 (twelve) hours. Remove & Discard patch within 12 hours or as directed by MD   Yes Historical Provider, MD  LORazepam (ATIVAN) 0.5 MG tablet  Take 0.5 mg by mouth every 2 (two) hours as needed  for anxiety.   Yes Historical Provider, MD  morphine (MS CONTIN) 30 MG 12 hr tablet 59m PO in the morning, 326mPO in the evening. 05/14/16  Yes Anne-Caroline NoMariea ClontsMD  ondansetron (ZOFRAN) 4 MG tablet Take 1 tablet (4 mg total) by mouth every 8 (eight) hours as needed for nausea or vomiting. 12/27/15  Yes FrMilinda PointerMD  oxyCODONE (OXY IR/ROXICODONE) 5 MG immediate release tablet Take 5 mg by mouth every 6 (six) hours as needed for severe pain.   Yes Historical Provider, MD  pantoprazole (PROTONIX) 40 MG tablet Take 40 mg by mouth daily.    Yes Historical Provider, MD  prochlorperazine (COMPAZINE) 10 MG tablet Take 10 mg by mouth daily as needed.  04/16/16  Yes Historical Provider, MD  Alcohol Swabs (ALCOHOL PREP) PADS  06/27/15   Historical Provider, MD  Blood Glucose Calibration (TRUE METRIX LEVEL 1) Low SOLN  03/20/16   Historical Provider, MD  Blood Glucose Monitoring Suppl (TRUE METRIX AIR GLUCOSE METER) w/Device KIT  03/20/16   Historical Provider, MD  Cholecalciferol (VITAMIN D3) 2000 units capsule Take 1 capsule (2,000 Units total) by mouth daily. 09/06/15   FrMilinda PointerMD  losartan (COZAAR) 25 MG tablet Take 25 mg by mouth daily.    Historical Provider, MD  Melatonin 3 MG TABS Take 3 mg by mouth at bedtime.     Historical Provider, MD  Naloxone HCl (NARCAN) 4 MG/0.1ML LIQD Place 1 Bottle into the nose once. , then call 911, repeat if needed in other nostril with new bottle. 09/06/15   FrMilinda PointerMD  Oxycodone HCl 10 MG TABS Take 1 tablet (10 mg total) by mouth 5 (five) times daily as needed. 04/23/16 05/23/16  FrMilinda PointerMD  Oxycodone HCl 10 MG TABS Take 1 tablet (10 mg total) by mouth 5 (five) times daily as needed. 05/23/16 06/22/16  FrMilinda PointerMD  Oxycodone HCl 10 MG TABS Take 1 tablet (10 mg total) by mouth 5 (five) times daily as needed. 06/22/16 07/22/16  FrMilinda PointerMD  propranolol (INDERAL) 10 MG tablet Take 10 mg by mouth 2 (two) times daily.     Historical Provider, MD  psyllium (METAMUCIL) 58.6 % packet Take 1 packet by mouth daily.    Historical Provider, MD  spironolactone (ALDACTONE) 100 MG tablet Take 100 mg by mouth daily.  08/07/15 03/01/16  Historical Provider, MD  spironolactone (ALDACTONE) 100 MG tablet  03/05/16   Historical Provider, MD  TRUE METRIX BLOOD GLUCOSE TEST test strip  03/20/16   Historical Provider, MD  TRUEPLUS LANCETS 28G MI[place]10/11/17   Historical Provider, MD   No Known Allergies  FAMILY HISTORY:  family history includes Cancer in her mother; Heart disease in her father; Hypertension in her father. SOCIAL HISTORY:  reports that she has quit smoking. She has never used smokeless tobacco. She reports that she does not drink alcohol or use drugs.  REVIEW OF SYSTEMS:  Positives in BOLD  Constitutional: fever, chills, weight loss, malaise/fatigue, loss of appetite, and diaphoresis.  HENT: Negative for hearing loss, ear pain, nosebleeds, congestion, sore throat, neck pain, tinnitus and ear discharge.   Eyes: Negative for blurred vision, double vision, photophobia, pain, discharge and redness.  Respiratory: Negative for cough, hemoptysis, sputum production, shortness of breath, wheezing and stridor.   Cardiovascular: chest pain, palpitations, orthopnea, claudication, leg swelling and PND.  Gastrointestinal: Negative for heartburn, nausea, vomiting, abdominal pain,  diarrhea, constipation, blood in stool and melena.  Genitourinary: Negative for dysuria, urgency, frequency, hematuria and flank pain.  Musculoskeletal: Negative for myalgias, back pain, joint pain and falls.  Skin: Negative for itching and rash.  Neurological: dizziness, tingling, tremors, sensory change, speech change, focal weakness, seizures, loss of consciousness, weakness and headaches.  Endo/Heme/Allergies: Negative for environmental allergies and polydipsia. Does not bruise/bleed easily.  SUBJECTIVE:  Pt states she is no longer short of  breath she is currently on 2L O2 via nasal canula no other complaints at this time.  Pt states her blood pressure normally runs low.    VITAL SIGNS: Temp:  [96.9 F (36.1 C)-97.5 F (36.4 C)] 97.3 F (36.3 C) (03/06 0328) Pulse Rate:  [62-113] 77 (03/06 0328) Resp:  [18-27] 18 (03/06 0328) BP: (60-108)/(46-81) 60/50 (03/06 0328) SpO2:  [92 %-100 %] 92 % (03/06 0328) Weight:  [57.6 kg (127 lb)-59 kg (130 lb)] 57.6 kg (127 lb) (03/05 1740)  PHYSICAL EXAMINATION: General: severely cachectic frail appearing Caucasian female  Neuro: lethargic, oriented, follows commands PERRLA HEENT: supple, no JVD Cardiovascular: nsr, s1s2, no M/R/G, right sided chest portacath  Lungs: faint crackles throughout, even, non labored  Abdomen: +BS x4, soft, non tender, non distended  Musculoskeletal: cachectic, 2+ bilateral pitting lower extremity edema Skin: scattered ecchymosis    Recent Labs Lab 08/30/2016 1326  NA 134*  K 3.6  CL 102  CO2 24  BUN 35*  CREATININE 0.99  GLUCOSE 163*    Recent Labs Lab 08/31/2016 1326  HGB 10.4*  HCT 30.8*  WBC 10.4  PLT 222   Dg Chest 1 View  Result Date: 08/14/2016 CLINICAL DATA:  Post right-sided thoracentesis. EXAM: CHEST 1 VIEW COMPARISON:  Chest radiograph-earlier same day ; chest CT - earlier same day FINDINGS: Grossly unchanged cardiac silhouette and mediastinal contours. Stable position of support apparatus. Interval reduction in persistent small right-sided effusion post thoracentesis. Improved aeration of the right mid and lower lung with persistent bibasilar heterogeneous opacities, right greater than left. Unchanged trace left-sided pleural effusion. Ill-defined heterogeneous opacities about the left hilum are unchanged. No new focal airspace opacities. No pneumothorax. No evidence of edema. Multiple embolization coils are seen overlying the left upper abdominal quadrant. Additional tubing overlies the left upper abdominal quadrant. IMPRESSION: 1.  Interval reduction in persistent small right-sided effusion post thoracentesis. No pneumothorax. 2. Otherwise, stable abnormal examination as above. Electronically Signed   By: Sandi Mariscal M.D.   On: 08/26/2016 17:25   Dg Chest 2 View  Result Date: 08/28/2016 CLINICAL DATA:  Respiratory symptoms for 2-3 days. Patient being treated with chemotherapy for endometrial carcinoma. EXAM: CHEST  2 VIEW COMPARISON:  December 27, 2013 FINDINGS: A right Port-A-Cath terminates in good position. There is a moderate right-sided pleural effusion with underlying opacity. A smaller left effusion is identified. Patchy opacity is seen in the left mid lung with nodular components. One of the apparent nodular components is lucent centrally suggesting the possibility of central cavitation. No other interval changes or acute abnormalities. IMPRESSION: 1. Patchy nodular opacities in the left mid lung. One of these nodular opacities demonstrates central lucency suggesting the possibility of cavitation. The findings could represent an infectious or neoplastic process given history. Recommend clinical correlation and follow-up to resolution. 2. Moderate right and small left effusions with underlying opacities. Electronically Signed   By: Dorise Bullion III M.D   On: 09/04/2016 14:29   Ct Chest Wo Contrast  Result Date: 09/06/2016 CLINICAL DATA:  Respiratory symptoms  for 2 or 3 days. Started with sore throat, now shortness of breath. Being treated with chemotherapy for metastatic endometrial cancer. EXAM: CT CHEST WITHOUT CONTRAST TECHNIQUE: Multidetector CT imaging of the chest was performed following the standard protocol without IV contrast. COMPARISON:  Chest radiographs today. Abdominal CT 06/19/2010. No previous chest CT. FINDINGS: Cardiovascular: Mild aortic and coronary artery atherosclerosis. No acute vascular findings are demonstrated on noncontrast imaging. There is a right IJ central venous catheter with its tip in the mid SVC.  The heart size is normal. There may be a small amount of pericardial fluid anteriorly. Mediastinum/Nodes: The mediastinum is shifted into the left hemithorax. No enlarged mediastinal, hilar or axillary lymph nodes are seen, although hilar evaluation is limited by the lack of intravenous contrast. The thyroid gland, trachea and esophagus demonstrate no significant findings. Lungs/Pleura: There is a very large pleural effusion on the right with resulting mediastinal shift to the left. There is a small dependent left pleural effusion. The right lower lobe is completely collapsed. There is dependent atelectasis in the right upper and middle lobes. There are patchy perihilar airspace opacities throughout the left lung, greatest in the upper lobe. No discrete pulmonary nodules are seen. Upper abdomen: The liver is shrunken with diffuse contour irregularity, consistent with advanced cirrhosis. Grossly stable lobularity of the spleen status post previous embolization procedure. Musculoskeletal/Chest wall: There is no chest wall mass or suspicious osseous finding. There are scattered small sclerotic lesions in the spine which are grossly stable. IMPRESSION: 1. Large right and small left pleural effusions. The right pleural effusion results in partial right lung collapse and mediastinal shift to the left. Right-sided thoracentesis or chest tube placement should be considered for diagnostic and therapeutic purposes. 2. Patchy airspace opacities throughout the aerated left lung, greatest in the upper lobe, suspicious for pneumonia. 3. Morphologic changes of hepatic cirrhosis. Electronically Signed   By: Richardean Sale M.D.   On: 08/11/2016 15:23   US Thoracentesis Asp Pleural Space W/img Guide  Result Date: 08/26/2016 INDICATION: History of malignancy and cirrhosis, now with symptomatic right-sided sided pleural effusion. Please perform ultrasound-guided thoracentesis for diagnostic and therapeutic purposes EXAM: US  THORACENTESIS ASP PLEURAL SPACE W/IMG GUIDE COMPARISON:  Chest radiograph - earlier same day ; chest CT - earlier same day MEDICATIONS: None. COMPLICATIONS: None immediate. TECHNIQUE: Informed written consent was obtained from the patient after a discussion of the risks, benefits and alternatives to treatment. A timeout was performed prior to the initiation of the procedure. With the patient positioned left lateral decubitus, initial ultrasound scanning demonstrates a large right-sided pleural effusion. The right lateral lower chest was prepped and draped in the usual sterile fashion. 1% lidocaine was used for local anesthesia. An ultrasound image was saved for documentation purposes. An 8 Fr Safe-T-Centesis catheter was introduced. The thoracentesis was performed. The catheter was removed and a dressing was applied. The patient tolerated the procedure well without immediate post procedural complication. The patient was escorted to have an upright chest radiograph. FINDINGS: A total of approximately 1.9 liters of serous fluid was removed. Requested samples were sent to the laboratory. IMPRESSION: Successful ultrasound-guided right sided thoracentesis yielding 1.9 liters of pleural fluid. Electronically Signed   By: Sandi Mariscal M.D.   On: 08/14/2016 17:27    ASSESSMENT / PLAN: Acute hypoxic respiratory failure secondary to bilateral pleural effusions right greater than left (s/p right sided thoracentesis 08/12/16 with removal 1.9L pleural fluid) ?Septic shock with hypotension secondary to ?HCAP Stage IV endometrial adenocarcinoma with  metastatic disease  P: Supplemental O2 to maintain O2 sats >92% or for dyspnea Repeat CXR 03/6 may need repeat thoracentesis   Gentle fluid resuscitation to maintain map goal >65, however if bp does not improve will start levophed gtt  Continue zosyn would discontinue vancomycin MRSA PCR negative Follow cultures Trend WBC and monitor fever curve Trend PCT's and lactic acid    Echo pending  Palliative care consulted appreciate input  Update: No family at bedside to update.  Agree with palliative care consult to discuss goals of treatment due to overall poor prognosis and assist pt with understanding the importance of establishing a healthcare POA.  Marda Stalker, [place] Pager [phone] (please enter 7 digits) PCCM Consult Pager [phone] (please enter 7 digits)  PCCM ATTENDING ATTESTATION:  I have evaluated patient with the APP [place], reviewed database in its entirety and discussed care plan in detail. In addition, this patient was discussed on multidisciplinary rounds.   Important exam findings: Very frail appearing NAD Cognition intact No wheezes Reg, no M Abd soft Symmetric LE edema  CXR: moderate R pleural eff. LUL AS opacity  Major problems addressed by PCCM team: Acute respiratory failure - improved after large volume thoracentesis Large R pleural effusion - improved after large volume thoracentesis Hypotension after large volume thoracentesis - not on vasopressors Pulmonary infiltrates c/w possible pneumonia - minimally elevated PCT Hypoalbuminemia Metastatic endometrial cancer Poor functional status  PLAN/REC: DC NS infusion Albumin ordered If BP improves with albumin, transfer out of ICU/SDU - discussed with Dt [place] Consider narrowing abx or limiting duration Agree with Palliative Care involvement  PCCM will sign off. Please call if we can be of further assistance  Merton Border, MD PCCM service Mobile [phone] Pager [phone]

## 2016-08-13 NOTE — Progress Notes (Signed)
[place] for electrolyte monitoring  Pharmacy consulted for electrolyte management for 66 yo female admitted with acute on chronic respiratory failure secondary to bilateral pleural effusions.   Plan:   Patient ordered albumin 25g IV Q6 hr x 3 doses by CCM team. Will order potassium 88mEq PO x 1 and magnesium 4g IV x 1.   Will recheck BMP/Magnesium with am labs.    No Known Allergies  Patient Measurements: Height: 5\' 4"  (162.6 cm) Weight: 133 lb 12.8 oz (60.7 kg) IBW/kg (Calculated) : 54.7   Vital Signs: Temp: 97.6 F (36.4 C) (03/06 1731) Temp Source: Oral (03/06 1731) BP: 74/57 (03/06 1800) Pulse Rate: 95 (03/06 1800) Intake/Output from previous day: 03/05 0701 - 03/06 0700 In: 1150 [I.V.:1000; IV Piggyback:150] Out: -  Intake/Output from this shift: No intake/output data recorded.  Labs:  Recent Labs  08/14/2016 1326 08/13/16 0419 08/13/16 0830  WBC 10.4 10.3  --   HGB 10.4* 9.6*  --   HCT 30.8* 28.7*  --   PLT 222 170  --   CREATININE 0.99 1.13*  --   MG  --   --  1.5*  ALBUMIN 1.8*  --  1.6*  PROT 5.2*  --   --   AST 25  --   --   ALT 9*  --   --   ALKPHOS 73  --   --   BILITOT 1.5*  --   --    Estimated Creatinine Clearance: 42.9 mL/min (by C-G formula based on SCr of 1.13 mg/dL (H)).   Pharmacy will continue to monitor and adjust per consult.   Jamiria Langill L 08/13/2016,7:46 PM

## 2016-08-13 NOTE — Progress Notes (Signed)
PHARMACY - PHYSICIAN COMMUNICATION CRITICAL VALUE ALERT - BLOOD CULTURE IDENTIFICATION (BCID)  Results for orders placed or performed during the hospital encounter of 09/06/2016  Blood Culture ID Panel (Reflexed) (Collected: 09/06/2016  3:48 PM)  Result Value Ref Range   Enterococcus species NOT DETECTED NOT DETECTED   Listeria monocytogenes NOT DETECTED NOT DETECTED   Staphylococcus species DETECTED (A) NOT DETECTED   Staphylococcus aureus NOT DETECTED NOT DETECTED   Methicillin resistance DETECTED (A) NOT DETECTED   Streptococcus species NOT DETECTED NOT DETECTED   Streptococcus agalactiae NOT DETECTED NOT DETECTED   Streptococcus pneumoniae NOT DETECTED NOT DETECTED   Streptococcus pyogenes NOT DETECTED NOT DETECTED   Acinetobacter baumannii NOT DETECTED NOT DETECTED   Enterobacteriaceae species NOT DETECTED NOT DETECTED   Enterobacter cloacae complex NOT DETECTED NOT DETECTED   Escherichia coli NOT DETECTED NOT DETECTED   Klebsiella oxytoca NOT DETECTED NOT DETECTED   Klebsiella pneumoniae NOT DETECTED NOT DETECTED   Proteus species NOT DETECTED NOT DETECTED   Serratia marcescens NOT DETECTED NOT DETECTED   Haemophilus influenzae NOT DETECTED NOT DETECTED   Neisseria meningitidis NOT DETECTED NOT DETECTED   Pseudomonas aeruginosa NOT DETECTED NOT DETECTED   Candida albicans NOT DETECTED NOT DETECTED   Candida glabrata NOT DETECTED NOT DETECTED   Candida krusei NOT DETECTED NOT DETECTED   Candida parapsilosis NOT DETECTED NOT DETECTED   Candida tropicalis NOT DETECTED NOT DETECTED    Name of physician (or Provider) Contacted: Vachhani   Changes to prescribed antibiotics required: No changes.   Lekeshia Kram D 08/13/2016  6:24 PM

## 2016-08-13 NOTE — Progress Notes (Signed)
Patient became hypotensive this shift, pt asymptomatic, MD notified several times this shift due to low BP, three 250 ml bolus given and midodrine 2.5 mg PO given with no improvement, pt remained hypotensive. MD notified, patient transferred to stepdown unit per MD order.

## 2016-08-13 NOTE — Progress Notes (Signed)
Pt transferred to room 107 without incident.

## 2016-08-13 NOTE — Progress Notes (Signed)
[facility] NAME: Elizabeth Monroe    MR#:  [id number]  DATE OF BIRTH:  03-21-1951  SUBJECTIVE:  Patient weak and thristy no fevers overnight n back pain or chest pain +SOB  REVIEW OF SYSTEMS:    Review of Systems  Constitutional: Positive for malaise/fatigue. Negative for chills and fever.  HENT: Negative.  Negative for ear discharge, ear pain, hearing loss, nosebleeds and sore throat.   Eyes: Negative.  Negative for blurred vision and pain.  Respiratory: Positive for cough and shortness of breath. Negative for hemoptysis and wheezing.   Cardiovascular: Negative.  Negative for chest pain, palpitations and leg swelling.  Gastrointestinal: Negative.  Negative for abdominal pain, blood in stool, diarrhea, nausea and vomiting.  Genitourinary: Negative.  Negative for dysuria.  Musculoskeletal: Negative.  Negative for back pain.  Skin: Negative.   Neurological: Positive for weakness. Negative for dizziness, tremors, speech change, focal weakness, seizures and headaches.  Endo/Heme/Allergies: Negative.  Does not bruise/bleed easily.  Psychiatric/Behavioral: Negative.  Negative for depression, hallucinations and suicidal ideas.    Tolerating Diet: yes      DRUG ALLERGIES:  No Known Allergies  VITALS:  Blood pressure 106/76, pulse 98, temperature 97.7 F (36.5 C), temperature source Axillary, resp. rate 16, height 5\' 4"  (1.626 m), weight 59.8 kg (131 lb 13.4 oz), SpO2 98 %.  PHYSICAL EXAMINATION:   Physical Exam  Constitutional: She is oriented to person, place, and time. No distress.  Frail, thin  HENT:  Head: Normocephalic.  Eyes: No scleral icterus.  Neck: Normal range of motion. Neck supple. No JVD present. No tracheal deviation present.  Cardiovascular: Normal rate, regular rhythm and normal heart sounds.  Exam reveals no gallop and no friction rub.   No murmur heard. Pulmonary/Chest: Effort normal and breath sounds  normal. No respiratory distress. She has no wheezes. She has no rales. She exhibits no tenderness.  Abdominal: Soft. Bowel sounds are normal. She exhibits no distension and no mass. There is no tenderness. There is no rebound and no guarding.  Musculoskeletal: Normal range of motion. She exhibits no edema.  Neurological: She is alert and oriented to person, place, and time.  Skin: Skin is warm. No rash noted. No erythema.  Psychiatric: Affect and judgment normal.      LABORATORY PANEL:   CBC  Recent Labs Lab 08/13/16 0419  WBC 10.3  HGB 9.6*  HCT 28.7*  PLT 170   ------------------------------------------------------------------------------------------------------------------  Chemistries   Recent Labs Lab 08/13/2016 1326 08/13/16 0419  NA 134* 136  K 3.6 3.3*  CL 102 104  CO2 24 22  GLUCOSE 163* 144*  BUN 35* 34*  CREATININE 0.99 1.13*  CALCIUM 7.4* 6.8*  AST 25  --   ALT 9*  --   ALKPHOS 73  --   BILITOT 1.5*  --    ------------------------------------------------------------------------------------------------------------------  Cardiac Enzymes  Recent Labs Lab 08/26/2016 1326  TROPONINI <0.03   ------------------------------------------------------------------------------------------------------------------  RADIOLOGY:  Dg Chest 1 View  Result Date: 08/17/2016 CLINICAL DATA:  Post right-sided thoracentesis. EXAM: CHEST 1 VIEW COMPARISON:  Chest radiograph-earlier same day ; chest CT - earlier same day FINDINGS: Grossly unchanged cardiac silhouette and mediastinal contours. Stable position of support apparatus. Interval reduction in persistent small right-sided effusion post thoracentesis. Improved aeration of the right mid and lower lung with persistent bibasilar heterogeneous opacities, right greater than left. Unchanged trace left-sided pleural effusion. Ill-defined heterogeneous opacities about the left hilum are unchanged. No new  focal airspace opacities.  No pneumothorax. No evidence of edema. Multiple embolization coils are seen overlying the left upper abdominal quadrant. Additional tubing overlies the left upper abdominal quadrant. IMPRESSION: 1. Interval reduction in persistent small right-sided effusion post thoracentesis. No pneumothorax. 2. Otherwise, stable abnormal examination as above. Electronically Signed   By: Sandi Mariscal M.D.   On: 08/30/2016 17:25   Dg Chest 2 View  Result Date: 08/25/2016 CLINICAL DATA:  Respiratory symptoms for 2-3 days. Patient being treated with chemotherapy for endometrial carcinoma. EXAM: CHEST  2 VIEW COMPARISON:  December 27, 2013 FINDINGS: A right Port-A-Cath terminates in good position. There is a moderate right-sided pleural effusion with underlying opacity. A smaller left effusion is identified. Patchy opacity is seen in the left mid lung with nodular components. One of the apparent nodular components is lucent centrally suggesting the possibility of central cavitation. No other interval changes or acute abnormalities. IMPRESSION: 1. Patchy nodular opacities in the left mid lung. One of these nodular opacities demonstrates central lucency suggesting the possibility of cavitation. The findings could represent an infectious or neoplastic process given history. Recommend clinical correlation and follow-up to resolution. 2. Moderate right and small left effusions with underlying opacities. Electronically Signed   By: Dorise Bullion III M.D   On: 08/22/2016 14:29   Ct Chest Wo Contrast  Result Date: 08/14/2016 CLINICAL DATA:  Respiratory symptoms for 2 or 3 days. Started with sore throat, now shortness of breath. Being treated with chemotherapy for metastatic endometrial cancer. EXAM: CT CHEST WITHOUT CONTRAST TECHNIQUE: Multidetector CT imaging of the chest was performed following the standard protocol without IV contrast. COMPARISON:  Chest radiographs today. Abdominal CT 06/19/2010. No previous chest CT. FINDINGS:  Cardiovascular: Mild aortic and coronary artery atherosclerosis. No acute vascular findings are demonstrated on noncontrast imaging. There is a right IJ central venous catheter with its tip in the mid SVC. The heart size is normal. There may be a small amount of pericardial fluid anteriorly. Mediastinum/Nodes: The mediastinum is shifted into the left hemithorax. No enlarged mediastinal, hilar or axillary lymph nodes are seen, although hilar evaluation is limited by the lack of intravenous contrast. The thyroid gland, trachea and esophagus demonstrate no significant findings. Lungs/Pleura: There is a very large pleural effusion on the right with resulting mediastinal shift to the left. There is a small dependent left pleural effusion. The right lower lobe is completely collapsed. There is dependent atelectasis in the right upper and middle lobes. There are patchy perihilar airspace opacities throughout the left lung, greatest in the upper lobe. No discrete pulmonary nodules are seen. Upper abdomen: The liver is shrunken with diffuse contour irregularity, consistent with advanced cirrhosis. Grossly stable lobularity of the spleen status post previous embolization procedure. Musculoskeletal/Chest wall: There is no chest wall mass or suspicious osseous finding. There are scattered small sclerotic lesions in the spine which are grossly stable. IMPRESSION: 1. Large right and small left pleural effusions. The right pleural effusion results in partial right lung collapse and mediastinal shift to the left. Right-sided thoracentesis or chest tube placement should be considered for diagnostic and therapeutic purposes. 2. Patchy airspace opacities throughout the aerated left lung, greatest in the upper lobe, suspicious for pneumonia. 3. Morphologic changes of hepatic cirrhosis. Electronically Signed   By: Richardean Sale M.D.   On: 08/14/2016 15:23   Dg Chest Port 1 View  Result Date: 08/13/2016 CLINICAL DATA:  Acute  respiratory failure EXAM: PORTABLE CHEST 1 VIEW COMPARISON:  08/13/2016 FINDINGS: Right jugular  port extends to the cavoatrial junction. Moderate right pleural effusion persists without significant change. Patchy multifocal airspace opacities persist. No pneumothorax. IMPRESSION: No significant interval change in the bilateral airspace opacities. Persistent right pleural effusion. Electronically Signed   By: Andreas Newport M.D.   On: 08/13/2016 04:36   US Thoracentesis Asp Pleural Space W/img Guide  Result Date: 08/10/2016 INDICATION: History of malignancy and cirrhosis, now with symptomatic right-sided sided pleural effusion. Please perform ultrasound-guided thoracentesis for diagnostic and therapeutic purposes EXAM: US THORACENTESIS ASP PLEURAL SPACE W/IMG GUIDE COMPARISON:  Chest radiograph - earlier same day ; chest CT - earlier same day MEDICATIONS: None. COMPLICATIONS: None immediate. TECHNIQUE: Informed written consent was obtained from the patient after a discussion of the risks, benefits and alternatives to treatment. A timeout was performed prior to the initiation of the procedure. With the patient positioned left lateral decubitus, initial ultrasound scanning demonstrates a large right-sided pleural effusion. The right lateral lower chest was prepped and draped in the usual sterile fashion. 1% lidocaine was used for local anesthesia. An ultrasound image was saved for documentation purposes. An 8 Fr Safe-T-Centesis catheter was introduced. The thoracentesis was performed. The catheter was removed and a dressing was applied. The patient tolerated the procedure well without immediate post procedural complication. The patient was escorted to have an upright chest radiograph. FINDINGS: A total of approximately 1.9 liters of serous fluid was removed. Requested samples were sent to the laboratory. IMPRESSION: Successful ultrasound-guided right sided thoracentesis yielding 1.9 liters of pleural fluid.  Electronically Signed   By: Sandi Mariscal M.D.   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   1. Septic shock with hypotension, hypothermia and tachycardia as well as lactic acidosis due to HCAP: Continue Levophed to keep map greater than 65 Continue Zosyn MRSA PCR was negative  2. Pleural effusion status post thoracentesis with 1.9L seroud fluid removed 3. 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 Liver cirrhosis due to hepatitis C: Due to hypotension Aldactone and Lasix are on hold  5. Severe protein calorie malnutrition: Dietary consult  6. Hypokalemia/hypocalcemia: Replete and check albumin level  7. Acute on Chronic respiratory failure on 2 L of oxygen due to pleural effusions and HCAP      Management plans discussed with the patient and she is in agreement.  CODE STATUS: FULL  Critical careTOTAL TIME TAKING CARE OF THIS PATIENT: 33 minutes.     POSSIBLE D/C 2-4 days, DEPENDING ON CLINICAL CONDITION.   Nickey Canedo M.D on 08/13/2016 at 8:15 AM  Between 7am to 6pm - Pager - [phone] After 6pm go to [email or web address] - password [email or web address] [facility]  Office  [phone]  CC: Primary care physician; No PCP Per Patient  Note: This dictation was prepared with Dragon dictation along with smaller phrase technology. Any transcriptional errors that result from this process are unintentional.

## 2016-08-13 NOTE — Progress Notes (Signed)
Upon transfer, patient MAP is less than 60. Verified reading manually. MAP remains less than 60 at 59 with manual BP of 78/50. Patient has order in place to notify MD if MAP is less than 60. Mody notified via telephone.   Verbal order given to administer 245mL NS bolus and to increase NS continuous fluids to 13ml/hr. Orders placed.   Will continue to monitor and notify MD if MAP is less than 60. Madlyn Frankel, RN

## 2016-08-13 NOTE — Progress Notes (Signed)
Mody notified of MAP of 56 post 234ml bolus. In addition, patient presenting with +3 edema and fine crackles in left lung base. Verbal order given to transfer patient back to stepdown. Patient updated. Madlyn Frankel, RN

## 2016-08-13 NOTE — Evaluation (Signed)
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    Clinical Impression  Pt presents with deficits in strength, transfers, mobility, gait, balance, and activity tolerance.  Pt's BP very low in the 70/50 mmHg range but per nursing MD gave approval to mobilize pt to pt's tolerance.  Pt reports history of low BP.  Pt max A with bed mobility and then able to sit at EOB around 8 min without any adverse symptoms with close SBA.  Pt unable to stand at EOB at this time and reports feeling very weak relative to prior level.  SpO2 remained > 92% during session with pt on 2LO2/min.  Pt will benefit from PT services to address above deficits for decreased caregiver assistance upon discharge.        Follow Up Recommendations SNF    Equipment Recommendations  None recommended by PT    Recommendations for Other Services       Precautions / Restrictions Precautions Precautions: Fall Restrictions Weight Bearing Restrictions: No      Mobility  Bed Mobility Overal bed mobility: Needs Assistance Bed Mobility: Supine to Sit;Sit to Supine     Supine to sit: Max assist Sit to supine: Max assist      Transfers                 General transfer comment: Unable  Ambulation/Gait             General Gait Details: Unable  Stairs            Wheelchair Mobility    Modified Rankin (Stroke Patients Only)       Balance Overall balance assessment: Needs assistance Sitting-balance support: Bilateral upper extremity supported Sitting balance-Leahy Scale:  Fair Sitting balance - Comments: SBA with sitting balance at EOB with mod A to help pt achieve optimal positioning       Standing balance comment: Unable                             Pertinent Vitals/Pain Pain Assessment: 0-10 Pain Score: 6  Pain Location: back pain Pain Descriptors / Indicators: Aching Pain Intervention(s): Premedicated before session;Monitored during session;Limited activity within patient's tolerance    Home Living Family/patient expects to be discharged to:: Skilled nursing facility                 Additional Comments: Pt in LTC at [facility] followed by [facility]    Prior Function Level of Independence: Needs assistance   Gait / Transfers Assistance Needed: SBA with RW limited functional distances, no recent falls  ADL's / Homemaking Assistance Needed: Assistance from staff with basic ADL needs        Hand Dominance        Extremity/Trunk Assessment   Upper Extremity Assessment Upper Extremity Assessment: Generalized weakness    Lower Extremity Assessment Lower Extremity Assessment: Generalized weakness       Communication   Communication: No difficulties  Cognition Arousal/Alertness: Awake/alert Behavior During Therapy: WFL for tasks assessed/performed Overall Cognitive Status: Within Functional Limits for tasks assessed  General Comments      Exercises Total Joint Exercises Ankle Circles/Pumps: AROM;Both;10 reps Towel Squeeze: AROM;Both;10 reps Long Arc Quad: AROM;Both;10 reps Knee Flexion: AROM;Both Other Exercises Other Exercises: Seated core therex and balance training with progressively narrower UE support on EOB to pt's tolerance    Assessment/Plan    PT Assessment Patient needs continued PT services  PT Problem List Decreased strength;Decreased activity tolerance;Decreased balance;Decreased mobility       PT Treatment Interventions DME instruction;Gait  training;Functional mobility training;Neuromuscular re-education;Balance training;Therapeutic exercise;Therapeutic activities;Patient/family education    PT Goals (Current goals can be found in the Care Plan section)  Acute Rehab PT Goals Patient Stated Goal: To get stronger PT Goal Formulation: With patient Time For Goal Achievement: 08/26/16 Potential to Achieve Goals: Fair    Frequency Min 2X/week   Barriers to discharge        Co-evaluation               End of Session Equipment Utilized During Treatment: Oxygen Activity Tolerance: Patient limited by fatigue Patient left: in bed;with bed alarm set;with nursing/sitter in room;with call bell/phone within reach Nurse Communication: Other (comment);Mobility status (SCDs in room but no machine to plug then into) PT Visit Diagnosis: Muscle weakness (generalized) (M62.81);Difficulty in walking, not elsewhere classified (R26.2)         Time: [id number] PT Time Calculation (min) (ACUTE ONLY): 25 min   Charges:   PT Evaluation $PT Eval Low Complexity: 1 Procedure PT Treatments $Therapeutic Exercise: 8-22 mins   PT G Codes:         DRoyetta Asal PT, DPT 08/13/16, 3:49 PM

## 2016-08-13 NOTE — Progress Notes (Signed)
Pt being transferred to room 107. Report called to Isaias Sakai, Therapist, sports. Pt's belongings gathered and placed on table. Pt eating lunch at this time, will transfer once finished.

## 2016-08-13 NOTE — Clinical Social Work Note (Signed)
Clinical Social Work Assessment  Patient Details  Name: Elizabeth Monroe MRN: [id number] Date of Birth: February 07, 1951  Date of referral:  08/13/16               Reason for consult:  Discharge Planning                Permission sought to share information with:  Case Manager, Facility Sport and exercise psychologist, Family Supports Permission granted to share information::  Yes, Verbal Permission Granted  Name::        Agency::  [facility]: [name]   Relationship::  Daughter and son  Contact Information:     Housing/Transportation Living arrangements for the past 2 months:  [place] of Information:  Scientist, water quality, Tourist information centre manager, Facility, Adult Children, Palliative Care Team Patient Interpreter Needed:  None Criminal Activity/Legal Involvement Pertinent to Current Situation/Hospitalization:  No - Comment as needed Significant Relationships:  Adult Children, Warehouse manager Lives with:  Facility Resident Do you feel safe going back to the place where you live?  Yes Need for family participation in patient care:  Yes (Comment)  Care giving concerns:  LCSW received call when covering ED that patient is from SNF: [facility], but on 3/5 was to transition to new SNF for long term care at [facility].  Patient was admitted to ICU and is followed by Hospice in the facility.  [facility] and community hospice both aware of patient and admission.  Patient plan will be to go back to SNF: [facility] under her Medicaid for LTC.   Social Worker assessment / plan:  LCSW completed consult in ED and met with community agencies. Family involved in care along with Hospice.  Plan:  Return to SNF: at [facility] once medically stable.  Employment status:  Retired Nurse, adult, Medicaid In [place] PT Recommendations:  Not assessed at this time [place] / Referral to community resources:  [place]  Patient/Family's Response to care:  Agreeable to  plan  Patient/Family's Understanding of and Emotional Response to Diagnosis, Current Treatment, and Prognosis:  Understanding of plan and agreeable.  Emotional Assessment Appearance:  Appears stated age Attitude/Demeanor/Rapport:    Affect (typically observed):  Adaptable Orientation:  Oriented to Self Alcohol / Substance use:  Not Applicable Psych involvement (Current and /or in the community):  No (Comment)  Discharge Needs  Concerns to be addressed:  No discharge needs identified Readmission within the last 30 days:  No Current discharge risk:  None Barriers to Discharge:  No Barriers Identified, Continued Medical Work up   [place], LCSW 08/13/2016, 9:29 AM

## 2016-08-13 NOTE — Progress Notes (Signed)
Called by RN taking care of patient for low blood pressure. Patient has received considerble bolus of fluids Will transfer patient to step down unit ans start patient on IV levophed drip to support blood pressure. Discussed with RN taking care of patient.

## 2016-08-13 NOTE — Progress Notes (Signed)
PT Cancellation Note  Patient Details Name: Elizabeth Monroe MRN: [id number] DOB: 29-Nov-1950   Cancelled Treatment:    Reason Eval/Treat Not Completed: Medical issues which prohibited therapy; Pt transferred to higher level of care, PT orders will be completed at this time.  Spoke to nursing in ICU regarding need for new PT orders when pt is medically appropriate.   Linus Salmons PT, DPT 08/13/16, 2:04 PM

## 2016-08-13 NOTE — Consult Note (Signed)
Consultation Note Date: 08/13/2016   Patient Name: Elizabeth Monroe  DOB: September 08, 1950  MRN: [id number]  Age / Sex: 66 y.o., female  PCP: No Pcp Per Patient Referring Physician: Bettey Costa, MD  Reason for Consultation: Establishing goals of care  HPI/Patient Profile: 66 y.o. female  with past medical history of stage IV endometrial cancer, hepatitis C, liver cirrhosis, CKD III, obstructive nephropathy s/p left nephrostomy tube, hypertension, fibromyalgia, depression, DVT, arthritis, and anxiety admitted on 09/05/2016 with shortness of breath. In ED, found to have bilateral pleural effusions (R>L). Thoracentesis performed on 3/5 with 1.8 L off. Septic shock with hypotension, hypothermia, and tachycardia due to HCAP. Receiving zosyn. Stage IV endometrial adenocarcinoma-current hospice patient. Also with severe protein calorie malnutrition. Palliative medicine consultation for goals of care.    Clinical Assessment and Goals of Care: I have reviewed medical records and met with patient at bedside to discuss diagnosis, GOC, EOL wishes, disposition and options.  Introduced Palliative Medicine as specialized medical care for people living with serious illness. It focuses on providing relief from the symptoms and stress of a serious illness. The goal is to improve quality of life for both the patient and the family.  [name] is able to participate in the conversation. She speaks of having endometrial cancer for three years and that "chemotherapy would kill me faster." She was living with her grandson but speaks of being a burden on him because he is young.   The patient speaks of POA documentation (although it is not in the chart). Her daughter and son have mental health concerns that inhibit them from making decisions for her. [name] tells me she would want her grandson, Elizabeth Monroe, to make decisions for her if she was  not able to do so.   Advanced directives, concepts specific to code status, artifical feeding and hydration, and rehospitalization were considered and discussed. The patient tells me she WOULD want to be resuscitated and on a breathing machine if necessary. Educated on my recommendation for DNR/DNI with cancer, chronic co-morbidities, unlikelihood of her surviving this with a meaningful chance of recovery, and resuscitation being more harm than good to her at the end of her life. Strongly encouraged she consider and discuss her wishes with her grandson.   Discussed hospice services to continue following at facility with the focus being on comfort measures and symptom management to prevent re-hospitalization. Comfort, quality, and dignity for the time she has left. Patient agrees.    SUMMARY OF RECOMMENDATIONS    Remains FULL code. I have educated and encouraged patient to talk with grandson about DNR and EOL wishes.   Patient wants grandson, Elizabeth Monroe to be POA. I have left him a voicemail and will further discuss advanced directives/GOC/EOL wishes if possible.   Hospice services to follow at SNF on discharge.   Code Status/Advance Care Planning:  Full code   Symptom Management:   Per attending  Palliative Prophylaxis:   Aspiration, Delirium Protocol, Oral Care and Turn Reposition  Psycho-social/Spiritual:   Desire for  further Chaplaincy support:no  Additional Recommendations: Caregiving  Support/Resources and Education on Hospice  Prognosis:   Unable to determine  Discharge Planning: [place] with Hospice      Primary Diagnoses: Present on Admission: . Sepsis ([place])  I have reviewed the medical record, interviewed the patient and family, and examined the patient. The following aspects are pertinent.  Past Medical History:  Diagnosis Date  . Anxiety   . Arthritis   . Bleeding gastrointestinal 04/26/2013  . Cancer ([place])   . Cardiac arrhythmia   .  Cirrhosis ([place])   . 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 Depression   . Fibromyalgia   . Hepatitis   . Hypertension   . Leg pain 06/14/2015  . Leg swelling 04/04/2015  . Lumbar radiculopathy 09/17/2015  . Malignant neoplasm of endometrium ([place]) 10/14/2013   Social History   Social History  . Marital status: Divorced    Spouse name: N/A  . Number of children: N/A  . Years of education: N/A   Social History Main Topics  . Smoking status: Former Research scientist (life sciences)  . Smokeless tobacco: Never Used  . Alcohol use No  . Drug use: No  . Sexual activity: Not Asked   Other Topics Concern  . None   Social History Narrative  . None   Family History  Problem Relation Age of Onset  . Cancer Mother   . Heart disease Father   . Hypertension Father    Scheduled Meds: . albumin human  25 g Intravenous Q6H  . docusate sodium  100 mg Oral BID  . enoxaparin (LOVENOX) injection  40 mg Subcutaneous Q24H  . gabapentin  300 mg Oral TID  . mouth rinse  15 mL Mouth Rinse BID  . morphine  15 mg Oral q morning - 10a  . morphine  30 mg Oral QHS  . pantoprazole  40 mg Oral Daily  . piperacillin-tazobactam (ZOSYN)  IV  3.375 g Intravenous Q8H  . psyllium  1 packet Oral Daily  . sodium chloride  250 mL Intravenous Once  . vancomycin  750 mg Intravenous Q18H   Continuous Infusions: . sodium chloride 10 mL/hr at 08/13/16 1441  . norepinephrine (LEVOPHED) Adult infusion Stopped (08/13/16 0800)   PRN Meds:.acetaminophen **OR** acetaminophen, albuterol, HYDROcodone-acetaminophen, iopamidol, LORazepam, ondansetron **OR** ondansetron (ZOFRAN) IV, polyethylene glycol Medications Prior to Admission:  Prior to Admission medications   Medication Sig Start Date End Date Taking? Authorizing Provider  docusate sodium (COLACE) 100 MG capsule Take 200 mg by mouth daily as needed for mild constipation.   Yes Historical Provider, MD  furosemide (LASIX) 40 MG tablet Take 40 mg by mouth daily.  03/05/16  Yes  Historical Provider, MD  gabapentin (NEURONTIN) 300 MG capsule Take 1 capsule (300 mg total) by mouth 3 (three) times daily. 12/27/15  Yes Milinda Pointer, MD  lactulose (CHRONULAC) 10 GM/15ML solution Take 10 g by mouth daily as needed.  01/27/16  Yes Historical Provider, MD  lidocaine (LIDODERM) 5 % Place 1 patch onto the skin every 12 (twelve) hours. Remove & Discard patch within 12 hours or as directed by MD   Yes Historical Provider, MD  LORazepam (ATIVAN) 0.5 MG tablet Take 0.5 mg by mouth every 2 (two) hours as needed for anxiety.   Yes Historical Provider, MD  morphine (MS CONTIN) 30 MG 12 hr tablet 66m PO in the morning, 377mPO in the evening. 05/14/16  Yes Anne-Caroline NoMariea ClontsMD  ondansetron (ZOFRAN) 4 MG  tablet Take 1 tablet (4 mg total) by mouth every 8 (eight) hours as needed for nausea or vomiting. 12/27/15  Yes Milinda Pointer, MD  oxyCODONE (OXY IR/ROXICODONE) 5 MG immediate release tablet Take 5 mg by mouth every 6 (six) hours as needed for severe pain.   Yes Historical Provider, MD  pantoprazole (PROTONIX) 40 MG tablet Take 40 mg by mouth daily.    Yes Historical Provider, MD  prochlorperazine (COMPAZINE) 10 MG tablet Take 10 mg by mouth daily as needed.  04/16/16  Yes Historical Provider, MD  Alcohol Swabs (ALCOHOL PREP) PADS  06/27/15   Historical Provider, MD  Blood Glucose Calibration (TRUE METRIX LEVEL 1) Low SOLN  03/20/16   Historical Provider, MD  Blood Glucose Monitoring Suppl (TRUE METRIX AIR GLUCOSE METER) w/Device KIT  03/20/16   Historical Provider, MD  Cholecalciferol (VITAMIN D3) 2000 units capsule Take 1 capsule (2,000 Units total) by mouth daily. 09/06/15   Milinda Pointer, MD  losartan (COZAAR) 25 MG tablet Take 25 mg by mouth daily.    Historical Provider, MD  Melatonin 3 MG TABS Take 3 mg by mouth at bedtime.     Historical Provider, MD  Naloxone HCl (NARCAN) 4 MG/0.1ML LIQD Place 1 Bottle into the nose once. , then call 911, repeat if needed in other nostril  with new bottle. 09/06/15   Milinda Pointer, MD  Oxycodone HCl 10 MG TABS Take 1 tablet (10 mg total) by mouth 5 (five) times daily as needed. 04/23/16 05/23/16  Milinda Pointer, MD  Oxycodone HCl 10 MG TABS Take 1 tablet (10 mg total) by mouth 5 (five) times daily as needed. 05/23/16 06/22/16  Milinda Pointer, MD  Oxycodone HCl 10 MG TABS Take 1 tablet (10 mg total) by mouth 5 (five) times daily as needed. 06/22/16 07/22/16  Milinda Pointer, MD  propranolol (INDERAL) 10 MG tablet Take 10 mg by mouth 2 (two) times daily.    Historical Provider, MD  psyllium (METAMUCIL) 58.6 % packet Take 1 packet by mouth daily.    Historical Provider, MD  spironolactone (ALDACTONE) 100 MG tablet Take 100 mg by mouth daily.  08/07/15 03/01/16  Historical Provider, MD  spironolactone (ALDACTONE) 100 MG tablet  03/05/16   Historical Provider, MD  TRUE METRIX BLOOD GLUCOSE TEST test strip  03/20/16   Historical Provider, MD  TRUEPLUS LANCETS 28G [place]  03/20/16   Historical Provider, MD   No Known Allergies Review of Systems  Constitutional: Positive for activity change and appetite change.  Respiratory: Positive for shortness of breath.    Physical Exam  Constitutional: She is oriented to person, place, and time. She appears cachectic. She appears ill.  HENT:  Head: Normocephalic and atraumatic.  Cardiovascular: Regular rhythm.   Pulmonary/Chest: She has decreased breath sounds.  Intermittent dyspnea at rest.  Abdominal: Normal appearance.  Neurological: She is alert and oriented to person, place, and time.  Skin: Skin is warm and dry. There is pallor.  Psychiatric: She has a normal mood and affect. Her speech is normal and behavior is normal.  Nursing note and vitals reviewed.  Vital Signs: BP (!) 78/50 (BP Location: Left Arm)   Pulse (!) 101   Temp 97.5 F (36.4 C) (Oral)   Resp 18   Ht _0  (1.626 m)   Wt 59.8 kg (131 lb 13.4 oz)   SpO2 98%   BMI 22.63 kg/m  Pain Assessment: No/denies  pain POSS *See Group Information*: 1-Acceptable,Awake and alert Pain Score: 0-No pain  SpO2: SpO2: 98 % O2 Device:SpO2: 98 % O2 Flow Rate: .O2 Flow Rate (L/min): 2 L/min  IO: Intake/output summary:   Intake/Output Summary (Last 24 hours) at 08/13/16 1512 Last data filed at 08/13/16 1441  Gross per 24 hour  Intake          2427.17 ml  Output              100 ml  Net          2327.17 ml    LBM: Last BM Date: 09/06/2016 Baseline Weight: Weight: 59 kg (130 lb) Most recent weight: Weight: 59.8 kg (131 lb 13.4 oz)     Palliative Assessment/Data:  PPS 30%   Flowsheet Rows   Flowsheet Row Most Recent Value  Intake Tab  Referral Department  Hospitalist  Unit at Time of Referral  ICU  Palliative Care Primary Diagnosis  Cancer  Date Notified  08/11/2016  Palliative Care Type  Return patient Palliative Care  Reason for referral  Clarify Goals of Care, Counsel Regarding Hospice  Date of Admission  08/28/2016  Date first seen by Palliative Care  08/13/16  # of days IP prior to Palliative referral  0  Clinical Assessment  Palliative Performance Scale Score  30%  Psychosocial & Spiritual Assessment  Palliative Care Outcomes  Patient/Family meeting held?  Yes  Who was at the meeting?  patient  Palliative Care Outcomes  Clarified goals of care, Provided psychosocial or spiritual support, ACP counseling assistance, Provided end of life care assistance, Counseled regarding hospice     Time In: 0920 Time Out: 1355 Time Total: 42mn Greater than 50%  of this time was spent counseling and coordinating care related to the above assessment and plan.  Signed by:  MIhor Dow FNP-C Palliative Medicine Team  Phone: [phone]: [phone]  Please contact Palliative Medicine Team phone at [phone] questions and concerns.  For individual provider: See AShea Evans

## 2016-08-13 NOTE — Progress Notes (Signed)
Visit made. Patient seen sitting up in bed alert and oriented, now transferred to the ICU d/t low BP overnight. She has required vasopressors, currently off. Palliative Care now consulted for goals pf care. Patient's niece Davy Pique and church friend  Cheri Rous present during visit, patient reports she spoke with her grandson Damarius on the phone, he is aware she is in the hospital. Pateint continues to express her desire to discharge to [facility] when ready. She continues on IV antibiotics and IV fluids. Emotional support given, chart notes reviewed. Will continue to follow and update hospice team. Thank you. Flo Shanks RN, BSN, [facility] [phone] c

## 2016-08-13 NOTE — NC FL2 (Signed)
[place] LEVEL OF CARE SCREENING TOOL     IDENTIFICATION  Patient Name: Elizabeth Monroe Birthdate: 1950-10-23 Sex: female Admission Date (Current Location): 08/29/2016  [facility] and [place] Number:  Engineering geologist and Address:  [facility], [address] [place], [place] [address]      Provider Number: [id number]  Attending Physician Name and Address:  Bettey Costa, MD  Relative Name and Phone Number:       Current Level of Care: Hospital Recommended Level of Care: [place] Prior Approval Number:    Date Approved/Denied:   PASRR Number:    Discharge Plan: SNF    Current Diagnoses: Patient Active Problem List   Diagnosis Date Noted  . Sepsis ([place]) 08/22/2016  . Chronic pain syndrome 04/23/2016  . Diabetes mellitus ([place]) 04/23/2016  . Drug-induced nausea and vomiting 12/27/2015  . Neurogenic pain 12/27/2015  . Deep vein thrombosis of portal vein 10/24/2015  . Adenocarcinoma of endometrium ([place]) 10/23/2015  . Moderate episode of recurrent major depressive disorder ([place]) 09/06/2015  . Liver cirrhosis ([place]) 09/06/2015  . Metastasis to spinal column ([place]) 09/06/2015  . Vitamin D deficiency 07/03/2015  . Malignant melanoma ([place]) 06/14/2015  . Chronic viral hepatitis C ([place]) 06/14/2015  . Chronic kidney disease 06/14/2015  . Long term current use of opiate analgesic 06/14/2015  . Long term prescription opiate use 06/14/2015  . Opiate use (90 MME/Day) 06/14/2015  . Encounter for therapeutic drug level monitoring 06/14/2015  . Cancer-related pain 06/14/2015  . History of metastatic endometrial cancer (metastases to lymph nodes) 06/14/2015  . Chronic low back pain 06/14/2015  . Chronic neck pain 06/14/2015  . Chronic hip pain 06/14/2015  . Endometrial cancer ([place]) 06/14/2015  . Chronic pain of lower extremity 06/14/2015  . Chronic radicular pain of lower extremity 06/14/2015  . Arthropathy of hip 06/14/2015  .  History of uterine cancer 06/14/2015  . Pain of metastatic malignancy 06/14/2015  . Other specified soft tissue disorders 04/04/2015  . Malignant neoplasm of endometrium ([place]) 10/14/2013  . 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 Lumbar DDD 04/26/2013  . Bleeding gastrointestinal 04/26/2013  . BP (high blood pressure) 03/01/2013  . Hypertension 03/01/2013  . HCV (hepatitis C virus) 10/20/2012    Orientation RESPIRATION BLADDER Height & Weight     Self  O2 (3L while in ICU) Incontinent Weight: 131 lb 13.4 oz (59.8 kg) Height:  5\' 4"  (162.6 cm)  BEHAVIORAL SYMPTOMS/MOOD NEUROLOGICAL BOWEL NUTRITION STATUS      Incontinent Diet (see DC summary)  AMBULATORY STATUS COMMUNICATION OF NEEDS Skin   Extensive Assist Verbally Normal                       Personal Care Assistance Level of Assistance  Bathing, Feeding, Dressing Bathing Assistance: Maximum assistance Feeding assistance: Limited assistance Dressing Assistance: Maximum assistance     Functional Limitations Info  Sight, Hearing, Speech Sight Info: Adequate Hearing Info: Adequate Speech Info: Adequate    SPECIAL CARE FACTORS FREQUENCY                       Contractures Contractures Info: Not present    Additional Factors Info  Code Status, Allergies Code Status Info: Full Code Allergies Info: NKA           Current Medications (08/13/2016):  This is the current hospital active medication list Current Facility-Administered Medications  Medication Dose Route Frequency Provider Last Rate Last  Dose  . 0.9 %  sodium chloride infusion   Intravenous Continuous Wilhelmina Mcardle, MD 75 mL/hr at 08/13/16 0015    . acetaminophen (TYLENOL) tablet 650 mg  650 mg Oral Q6H PRN Hillary Bow, MD       Or  . acetaminophen (TYLENOL) suppository 650 mg  650 mg Rectal Q6H PRN Srikar Sudini, MD      . albumin human 5 % solution 25 g  25 g Intravenous Q6H Wilhelmina Mcardle, MD      . albuterol (PROVENTIL) (2.5 MG/3ML) 0.083%  nebulizer solution 2.5 mg  2.5 mg Nebulization Q2H PRN Srikar Sudini, MD      . docusate sodium (COLACE) capsule 100 mg  100 mg Oral BID Hillary Bow, MD   100 mg at 08/25/2016 2154  . enoxaparin (LOVENOX) injection 40 mg  40 mg Subcutaneous Q24H Srikar Sudini, MD      . gabapentin (NEURONTIN) capsule 300 mg  300 mg Oral TID Hillary Bow, MD   300 mg at 08/20/2016 2154  . HYDROcodone-acetaminophen (NORCO/VICODIN) 5-325 MG per tablet 1-2 tablet  1-2 tablet Oral Q4H PRN Hillary Bow, MD   1 tablet at 09/03/2016 1823  . iopamidol (ISOVUE-370) 76 % injection 75 mL  75 mL Intravenous Once PRN Earleen Newport, MD      . LORazepam (ATIVAN) tablet 0.5 mg  0.5 mg Oral Q2H PRN Hillary Bow, MD      . morphine (MS CONTIN) 12 hr tablet 15 mg  15 mg Oral q morning - 10a Srikar Sudini, MD      . morphine (MS CONTIN) 12 hr tablet 30 mg  30 mg Oral QHS Srikar Sudini, MD      . norepinephrine (LEVOPHED) 4 mg in dextrose 5 % 250 mL (0.016 mg/mL) infusion  0-40 mcg/min Intravenous Titrated Saundra Shelling, MD   Stopped at 08/13/16 0800  . ondansetron (ZOFRAN) tablet 4 mg  4 mg Oral Q6H PRN Hillary Bow, MD       Or  . ondansetron (ZOFRAN) injection 4 mg  4 mg Intravenous Q6H PRN Srikar Sudini, MD      . pantoprazole (PROTONIX) EC tablet 40 mg  40 mg Oral Daily Srikar Sudini, MD      . piperacillin-tazobactam (ZOSYN) IVPB 3.375 g  3.375 g Intravenous Q8H Srikar Sudini, MD   3.375 g at 08/13/16 0509  . polyethylene glycol (MIRALAX / GLYCOLAX) packet 17 g  17 g Oral Daily PRN Srikar Sudini, MD      . potassium chloride (KLOR-CON) packet 40 mEq  40 mEq Oral Once Bettey Costa, MD      . psyllium (HYDROCIL/METAMUCIL) packet 1 packet  1 packet Oral Daily Srikar Sudini, MD      . vancomycin (VANCOCIN) IVPB 750 mg/150 ml premix  750 mg Intravenous Q18H Hillary Bow, MD   750 mg at 08/13/16 0014     Discharge Medications: Please see discharge summary for a list of discharge medications.  Relevant Imaging  Results:  Relevant Lab Results:   Additional Information SSN:  [id number]  [place], [place]

## 2016-08-14 ENCOUNTER — Inpatient Hospital Stay
Admit: 2016-08-14 | Discharge: 2016-08-14 | Disposition: A | Discharge status: Home / Self Care | Attending: Critical Care Medicine | Admitting: Critical Care Medicine

## 2016-08-14 DIAGNOSIS — C541 Malignant neoplasm of endometrium: Secondary | ICD-10-CM

## 2016-08-14 DIAGNOSIS — E43 Unspecified severe protein-calorie malnutrition: Secondary | ICD-10-CM | POA: Insufficient documentation

## 2016-08-14 DIAGNOSIS — I959 Hypotension, unspecified: Secondary | ICD-10-CM

## 2016-08-14 LAB — BASIC METABOLIC PANEL
Anion gap: 7 (ref 5–15)
BUN: 33 mg/dL — AB (ref 6–20)
CALCIUM: 7.2 mg/dL — AB (ref 8.9–10.3)
CO2: 23 mmol/L (ref 22–32)
CREATININE: 1.1 mg/dL — AB (ref 0.44–1.00)
Chloride: 106 mmol/L (ref 101–111)
GFR calc non Af Amer: 52 mL/min — ABNORMAL LOW (ref >60)
GFR, EST AFRICAN AMERICAN: 60 mL/min — AB (ref >60)
Glucose, Bld: 116 mg/dL — ABNORMAL HIGH (ref 65–99)
Potassium: 3.4 mmol/L — ABNORMAL LOW (ref 3.5–5.1)
SODIUM: 136 mmol/L (ref 135–145)

## 2016-08-14 LAB — CBC
HCT: 23.7 % — ABNORMAL LOW (ref 35.0–47.0)
HEMOGLOBIN: 8.1 g/dL — AB (ref 12.0–16.0)
MCH: 33.8 pg (ref 26.0–34.0)
MCHC: 34 g/dL (ref 32.0–36.0)
MCV: 99.5 fL (ref 80.0–100.0)
PLATELETS: 120 10*3/uL — AB (ref 150–440)
RBC: 2.38 MIL/uL — ABNORMAL LOW (ref 3.80–5.20)
RDW: 19.1 % — AB (ref 11.5–14.5)
WBC: 8.2 10*3/uL (ref 3.6–11.0)

## 2016-08-14 LAB — IRON AND TIBC: Iron: 15 ug/dL — ABNORMAL LOW (ref 28–170)

## 2016-08-14 LAB — MAGNESIUM: MAGNESIUM: 2.6 mg/dL — AB (ref 1.7–2.4)

## 2016-08-14 LAB — VITAMIN B12: VITAMIN B 12: 546 pg/mL (ref 180–914)

## 2016-08-14 LAB — ECHOCARDIOGRAM COMPLETE
Height: 64 in
WEIGHTICAEL: 2151.69 [oz_av]

## 2016-08-14 LAB — RETICULOCYTES
RBC.: 2.67 MIL/uL — AB (ref 3.80–5.20)
RETIC CT PCT: 3.2 % — AB (ref 0.4–3.1)
Retic Count, Absolute: 85.4 10*3/uL (ref 19.0–183.0)

## 2016-08-14 LAB — PROCALCITONIN: PROCALCITONIN: 0.29 ng/mL

## 2016-08-14 LAB — FOLATE: Folate: 11.4 ng/mL (ref >5.9)

## 2016-08-14 LAB — LACTIC ACID, PLASMA: LACTIC ACID, VENOUS: 3.6 mmol/L — AB (ref 0.5–1.9)

## 2016-08-14 LAB — FERRITIN: FERRITIN: 65 ng/mL (ref 11–307)

## 2016-08-14 MED ORDER — UNJURY CHICKEN SOUP POWDER
2.0000 [oz_av] | Freq: Every day | ORAL | Status: DC
  Administered 2016-08-14: 2 [oz_av] via ORAL

## 2016-08-14 MED ORDER — VANCOMYCIN HCL IN DEXTROSE 750-5 MG/150ML-% IV SOLN
750.0000 mg | INTRAVENOUS | Status: DC
  Administered 2016-08-14: 750 mg via INTRAVENOUS
  Filled 2016-08-14 (×2): qty 150

## 2016-08-14 MED ORDER — DOCUSATE SODIUM 100 MG PO CAPS: 100.0000 mg | ORAL_CAPSULE | Freq: Two times a day (BID) | ORAL | Status: DC

## 2016-08-14 MED ORDER — POLYETHYLENE GLYCOL 3350 17 G PO PACK
17.0000 g | PACK | Freq: Every day | ORAL | Status: DC
  Administered 2016-08-14 – 2016-08-15 (×2): 17 g via ORAL
  Filled 2016-08-14 (×2): qty 1

## 2016-08-14 MED ORDER — POTASSIUM CHLORIDE CRYS ER 20 MEQ PO TBCR
20.0000 meq | EXTENDED_RELEASE_TABLET | Freq: Once | ORAL | Status: AC
  Administered 2016-08-14: 20 meq via ORAL
  Filled 2016-08-14: qty 1

## 2016-08-14 MED ORDER — MIDODRINE HCL 5 MG PO TABS
10.0000 mg | ORAL_TABLET | Freq: Three times a day (TID) | ORAL | Status: DC
  Administered 2016-08-14 – 2016-08-15 (×4): 10 mg via ORAL
  Filled 2016-08-14 (×4): qty 2

## 2016-08-14 NOTE — Progress Notes (Signed)
Initial Nutrition Assessment  DOCUMENTATION CODES:   Severe malnutrition in context of chronic illness  INTERVENTION:  - Downgrade diet, per pt request, from Regular to modified Dysphagia 3 Diet  - Provide Magic Cup BID,each supplement provides 290 calories and 9 grams protein - Provide Carnation Instant Breakfast q 24 hrs, provides 270 calories and 13 grams protein - Provide [place], chicken soup flavor, q 24 hrs, each supplement provides 100 calories and 21 grams protein  NUTRITION DIAGNOSIS:   Malnutrition related to chronic illness as evidenced by severe depletion of body fat, severe depletion of muscle mass, moderate to severe fluid accumulation, percent weight loss.  GOAL:   Patient will meet greater than or equal to 90% of their needs  MONITOR:   PO intake, Supplement acceptance, I & O's, Weight trends  REASON FOR ASSESSMENT:   Other (Comment) (Malnourished)    ASSESSMENT:   66 y.o. Female with PMH of Stage IV endometrial cancer not receiving treatment, HTN, cirrhosis, hepatitis, fibromyalgia, depression, anxiety, on 2L home O2 presents with Sepsis d/t HCAP.   Pt states she has had a decrease appetite for over 1 month period. Pt states she has no one to cook for her after her daughter moved out after a fight.   Pt states she had difficulty chewing. Pt requests her food be soft and ground up. Pt reports PTA she would only consume small snacks throughout the day, a few bites of "mushy" cereal, few bites of lean cuisine, half of a applesauce packet, and occasional half of a hotdog and just drinks water throughout the day.   PTA pt reports trying Ensure for a period of time but they became too "sweet". However, pt states ice cream, soup, or chocolate milk sound appetizing.  Pt states her appetite and taste are slowly coming back. Only one meal completion percentage was completed, 100% yesterday at lunch.  Pt unaware of weight loss. Per chart review it seems pt has had  significant weight loss but her recorded history is limited. According to weight history she has lost 31 lbs in 3 months (19% loss-significant for time frame). Weight loss may be even more substantial but masked due to moderate-severe edema of pt's lower extremities.   Palliative Care was consulted  Labs reviewed; CBG (133), K (3.4), Magnesium (2.6) Medications reviewed; Colace, Protonix, Metamucil  Nutrition-focused physical exam completed. Findings are severe fat depletion, severe muscle depletion and moderate-severe edema. Unable to assess depletion of legs d/t moderate-severe edema.  Diet Order:  Diet regular Room service appropriate? Yes; Fluid consistency: Thin  Skin:  Reviewed, no issues  Last BM:  3/5  Height:   Ht Readings from Last 1 Encounters:  08/13/16 5\' 4"  (1.626 m)    Weight:   Wt Readings from Last 1 Encounters:  08/14/16 134 lb 7.7 oz (61 kg)    Ideal Body Weight:  54.5 kg  BMI:  Body mass index is 23.08 kg/m.  Estimated Nutritional Needs:   Kcal:  1700-1900  Protein:  85-95 grams   Fluid:  >/= 1.5 L/d  EDUCATION NEEDS:   Education needs no appropriate at this time   [employer]

## 2016-08-14 NOTE — Progress Notes (Addendum)
[facility] NAME: Elizabeth Monroe    MR#:  [id number]  DATE OF BIRTH:  1950-07-13  SUBJECTIVE:  Patient says she does not want to have hospice right now she wants to have PT She says her BP runs low  She is Asymptomatic.  REVIEW OF SYSTEMS:    Review of Systems  Constitutional: Positive for malaise/fatigue. Negative for chills and fever.  HENT: Negative.  Negative for ear discharge, ear pain, hearing loss, nosebleeds and sore throat.   Eyes: Negative.  Negative for blurred vision and pain.  Respiratory: Negative for cough, hemoptysis, shortness of breath and wheezing.   Cardiovascular: Negative.  Negative for chest pain, palpitations and leg swelling.  Gastrointestinal: Negative.  Negative for abdominal pain, blood in stool, diarrhea, nausea and vomiting.  Genitourinary: Negative.  Negative for dysuria.  Musculoskeletal: Negative.  Negative for back pain.  Skin: Negative.   Neurological: Positive for weakness. Negative for dizziness, tremors, speech change, focal weakness, seizures and headaches.  Endo/Heme/Allergies: Negative.  Does not bruise/bleed easily.  Psychiatric/Behavioral: Negative.  Negative for depression, hallucinations and suicidal ideas.    Tolerating Diet: yes      DRUG ALLERGIES:  No Known Allergies  VITALS:  Blood pressure (!) 79/55, pulse (!) 117, temperature 97.2 F (36.2 C), temperature source Oral, resp. rate (!) 28, height 5\' 4"  (1.626 m), weight 61 kg (134 lb 7.7 oz), SpO2 100 %.  PHYSICAL EXAMINATION:   Physical Exam  Constitutional: She is oriented to person, place, and time. No distress.  Frail, thin  HENT:  Head: Normocephalic.  Eyes: No scleral icterus.  Neck: Normal range of motion. Neck supple. No JVD present. No tracheal deviation present.  Cardiovascular: Normal rate, regular rhythm and normal heart sounds.  Exam reveals no gallop and no friction rub.   No murmur  heard. Pulmonary/Chest: Effort normal and breath sounds normal. No respiratory distress. She has no wheezes. She has no rales. She exhibits no tenderness.  Abdominal: Soft. Bowel sounds are normal. She exhibits no distension and no mass. There is no tenderness. There is no rebound and no guarding.  Musculoskeletal: Normal range of motion. She exhibits no edema.  Neurological: She is alert and oriented to person, place, and time.  Skin: Skin is warm. No rash noted. No erythema.  Psychiatric: Affect and judgment normal.      LABORATORY PANEL:   CBC  Recent Labs Lab 08/14/16 0414  WBC 8.2  HGB 8.1*  HCT 23.7*  PLT 120*   ------------------------------------------------------------------------------------------------------------------  Chemistries   Recent Labs Lab 08/10/2016 1326  08/14/16 0414  NA 134*  < > 136  K 3.6  < > 3.4*  CL 102  < > 106  CO2 24  < > 23  GLUCOSE 163*  < > 116*  BUN 35*  < > 33*  CREATININE 0.99  < > 1.10*  CALCIUM 7.4*  < > 7.2*  MG  --   < > 2.6*  AST 25  --   --   ALT 9*  --   --   ALKPHOS 73  --   --   BILITOT 1.5*  --   --   < > = values in this interval not displayed. ------------------------------------------------------------------------------------------------------------------  Cardiac Enzymes  Recent Labs Lab 09/03/2016 1326  TROPONINI <0.03   ------------------------------------------------------------------------------------------------------------------  RADIOLOGY:  Dg Chest 1 View  Result Date: 08/29/2016 CLINICAL DATA:  Post right-sided thoracentesis. EXAM: CHEST 1 VIEW COMPARISON:  Chest  radiograph-earlier same day ; chest CT - earlier same day FINDINGS: Grossly unchanged cardiac silhouette and mediastinal contours. Stable position of support apparatus. Interval reduction in persistent small right-sided effusion post thoracentesis. Improved aeration of the right mid and lower lung with persistent bibasilar heterogeneous  opacities, right greater than left. Unchanged trace left-sided pleural effusion. Ill-defined heterogeneous opacities about the left hilum are unchanged. No new focal airspace opacities. No pneumothorax. No evidence of edema. Multiple embolization coils are seen overlying the left upper abdominal quadrant. Additional tubing overlies the left upper abdominal quadrant. IMPRESSION: 1. Interval reduction in persistent small right-sided effusion post thoracentesis. No pneumothorax. 2. Otherwise, stable abnormal examination as above. Electronically Signed   By: Sandi Mariscal M.D.   On: 08/09/2016 17:25   Dg Chest 2 View  Result Date: 09/06/2016 CLINICAL DATA:  Respiratory symptoms for 2-3 days. Patient being treated with chemotherapy for endometrial carcinoma. EXAM: CHEST  2 VIEW COMPARISON:  December 27, 2013 FINDINGS: A right Port-A-Cath terminates in good position. There is a moderate right-sided pleural effusion with underlying opacity. A smaller left effusion is identified. Patchy opacity is seen in the left mid lung with nodular components. One of the apparent nodular components is lucent centrally suggesting the possibility of central cavitation. No other interval changes or acute abnormalities. IMPRESSION: 1. Patchy nodular opacities in the left mid lung. One of these nodular opacities demonstrates central lucency suggesting the possibility of cavitation. The findings could represent an infectious or neoplastic process given history. Recommend clinical correlation and follow-up to resolution. 2. Moderate right and small left effusions with underlying opacities. Electronically Signed   By: Dorise Bullion III M.D   On: 08/21/2016 14:29   Ct Chest Wo Contrast  Result Date: 08/09/2016 CLINICAL DATA:  Respiratory symptoms for 2 or 3 days. Started with sore throat, now shortness of breath. Being treated with chemotherapy for metastatic endometrial cancer. EXAM: CT CHEST WITHOUT CONTRAST TECHNIQUE: Multidetector CT  imaging of the chest was performed following the standard protocol without IV contrast. COMPARISON:  Chest radiographs today. Abdominal CT 06/19/2010. No previous chest CT. FINDINGS: Cardiovascular: Mild aortic and coronary artery atherosclerosis. No acute vascular findings are demonstrated on noncontrast imaging. There is a right IJ central venous catheter with its tip in the mid SVC. The heart size is normal. There may be a small amount of pericardial fluid anteriorly. Mediastinum/Nodes: The mediastinum is shifted into the left hemithorax. No enlarged mediastinal, hilar or axillary lymph nodes are seen, although hilar evaluation is limited by the lack of intravenous contrast. The thyroid gland, trachea and esophagus demonstrate no significant findings. Lungs/Pleura: There is a very large pleural effusion on the right with resulting mediastinal shift to the left. There is a small dependent left pleural effusion. The right lower lobe is completely collapsed. There is dependent atelectasis in the right upper and middle lobes. There are patchy perihilar airspace opacities throughout the left lung, greatest in the upper lobe. No discrete pulmonary nodules are seen. Upper abdomen: The liver is shrunken with diffuse contour irregularity, consistent with advanced cirrhosis. Grossly stable lobularity of the spleen status post previous embolization procedure. Musculoskeletal/Chest wall: There is no chest wall mass or suspicious osseous finding. There are scattered small sclerotic lesions in the spine which are grossly stable. IMPRESSION: 1. Large right and small left pleural effusions. The right pleural effusion results in partial right lung collapse and mediastinal shift to the left. Right-sided thoracentesis or chest tube placement should be considered for diagnostic and therapeutic purposes.  2. Patchy airspace opacities throughout the aerated left lung, greatest in the upper lobe, suspicious for pneumonia. 3. Morphologic  changes of hepatic cirrhosis. Electronically Signed   By: Richardean Sale M.D.   On: 09/05/2016 15:23   Dg Chest Port 1 View  Result Date: 08/13/2016 CLINICAL DATA:  Acute respiratory failure EXAM: PORTABLE CHEST 1 VIEW COMPARISON:  09/02/2016 FINDINGS: Right jugular port extends to the cavoatrial junction. Moderate right pleural effusion persists without significant change. Patchy multifocal airspace opacities persist. No pneumothorax. IMPRESSION: No significant interval change in the bilateral airspace opacities. Persistent right pleural effusion. Electronically Signed   By: Andreas Newport M.D.   On: 08/13/2016 04:36   US Thoracentesis Asp Pleural Space W/img Guide  Result Date: 08/08/2016 INDICATION: History of malignancy and cirrhosis, now with symptomatic right-sided sided pleural effusion. Please perform ultrasound-guided thoracentesis for diagnostic and therapeutic purposes EXAM: US THORACENTESIS ASP PLEURAL SPACE W/IMG GUIDE COMPARISON:  Chest radiograph - earlier same day ; chest CT - earlier same day MEDICATIONS: None. COMPLICATIONS: None immediate. TECHNIQUE: Informed written consent was obtained from the patient after a discussion of the risks, benefits and alternatives to treatment. A timeout was performed prior to the initiation of the procedure. With the patient positioned left lateral decubitus, initial ultrasound scanning demonstrates a large right-sided pleural effusion. The right lateral lower chest was prepped and draped in the usual sterile fashion. 1% lidocaine was used for local anesthesia. An ultrasound image was saved for documentation purposes. An 8 Fr Safe-T-Centesis catheter was introduced. The thoracentesis was performed. The catheter was removed and a dressing was applied. The patient tolerated the procedure well without immediate post procedural complication. The patient was escorted to have an upright chest radiograph. FINDINGS: A total of approximately 1.9 liters of serous  fluid was removed. Requested samples were sent to the laboratory. IMPRESSION: Successful ultrasound-guided right sided thoracentesis yielding 1.9 liters of pleural fluid. Electronically Signed   By: Sandi Mariscal M.D.   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   1. Septic shock with hypotension, hypothermia and tachycardia as well as lactic acidosis due to HCAP: Continue Zosyn MRSA PCR was negative Blood culture 1 out of 2 bottles coag-negative staph Midodrine started this am Hold BP meds due to low BP  2. Pleural effusion status post thoracentesis with 1.9L seroud fluid removed 3. 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 Liver cirrhosis due to hepatitis C: Due to hypotension Aldactone and Lasix are on hold  5. Severe protein calorie malnutrition: Dietary consult appreciated  6. Hypokalemia/hypocalcemia/hypomagnesia: repleting per pharmacy  7. Acute on Chronic respiratory failure on 2 L of oxygen due to pleural effusions and HCAP  8. Anemia, acute on chronic: Order iron panel And follow cbc in am  PT evaluation Ts to floor if BP remains stable   Management plans discussed with the patient and she is in agreement.  CODE STATUS: FULL  Critical careTOTAL TIME TAKING CARE OF THIS PATIENT:27  minutes.     POSSIBLE D/C 2 days, DEPENDING ON CLINICAL CONDITION.   Dennie Moltz  M.D on 08/14/2016 at 12:01 PM  Between 7am to 6pm - Pager - [phone] After 6pm go to [email or web address] - password [email or web address] [facility]  Office  [phone]  CC: Primary care physician; No PCP Per Patient  Note: This dictation was prepared with Dragon dictation along with smaller phrase technology. Any transcriptional errors that result from this process are unintentional.

## 2016-08-14 NOTE — Progress Notes (Signed)
Family Meeting Note  Advance Directive:yes  Today a meeting took place with the Patient.   The following clinical team members were present during this meeting:MD Hospice nurse  The following were discussed:Patient's diagnosis:septic shock Stage IV endometrial cancer , Patient's progosis: < 12 months and Goals for treatment: Full Code  Additional follow-up to be provided: Patient would like to try physical therapy at discharge at skilled nursing facility and is deferring hospice at this time  Time spent during discussion:16 minutes  Eldra Word, MD

## 2016-08-14 NOTE — Progress Notes (Signed)
Per Dr. Benjie Karvonen d/c vancomycin at this time. Order placed. Wilnette Kales

## 2016-08-14 NOTE — Progress Notes (Signed)
Daily Progress Note   Patient Name: Elizabeth Monroe       Date: 08/14/2016 DOB: [birth year]  Age: 66 y.o. MRN#: [id number] Attending Physician: Bettey Costa, MD Primary Care Physician: No PCP Per Patient Admit Date: 08/14/2016  Reason for Consultation/Follow-up: Establishing goals of care  Subjective: Patient awake, alert, and oriented this morning. Asking for a menu to order breakfast. No complaints of pain or discomfort.  Again discussed course of hospitalization, underlying co-morbidities, guarded prognosis, advanced directives/code status, EOL wishes, and hospice services. After education provided, patient wishes to remain a FULL code. Encouraged she read Hard Choices copy and continue these conversations with her grandson (who she says is her legal POA--no documentation). Agreeable with hospice services to continue at SNF.  Updated grandson, [name] via telephone. He is not able to be at the hospital today. Educated again on code status, advanced directives, and my recommendation for DNR/DNI. He respects his grandmother's wishes and agrees with FULL code at this time.   Length of Stay: 2  Current Medications: Scheduled Meds:  . docusate sodium  100 mg Oral BID  . enoxaparin (LOVENOX) injection  40 mg Subcutaneous Q24H  . gabapentin  300 mg Oral TID  . mouth rinse  15 mL Mouth Rinse BID  . morphine  15 mg Oral q morning - 10a  . morphine  30 mg Oral QHS  . pantoprazole  40 mg Oral Daily  . piperacillin-tazobactam (ZOSYN)  IV  3.375 g Intravenous Q8H  . psyllium  1 packet Oral Daily  . vancomycin  750 mg Intravenous Q18H    Continuous Infusions: . sodium chloride 75 mL/hr at 08/13/16 1620  . norepinephrine (LEVOPHED) Adult infusion Stopped (08/13/16 0800)    PRN  Meds: acetaminophen **OR** acetaminophen, albuterol, HYDROcodone-acetaminophen, iopamidol, LORazepam, ondansetron **OR** ondansetron (ZOFRAN) IV, polyethylene glycol  Physical Exam  Constitutional: She is oriented to person, place, and time. She is cooperative. She appears ill.  HENT:  Head: Normocephalic and atraumatic.  Cardiovascular: Regular rhythm.   Pulmonary/Chest: Effort normal. She has decreased breath sounds.  Abdominal: Normal appearance and bowel sounds are normal.  Neurological: She is alert and oriented to person, place, and time.  Skin: Skin is warm and dry. Ecchymosis noted. There is pallor.  Psychiatric: She has a normal mood and affect. Her speech is normal  and behavior is normal. Cognition and memory are normal.  Nursing note and vitals reviewed.          Vital Signs: BP 93/66   Pulse 92   Temp 97.1 F (36.2 C) (Oral)   Resp 15   Ht 5\' 4"  (1.626 m)   Wt 61 kg (134 lb 7.7 oz)   SpO2 (!) 80%   BMI 23.08 kg/m  SpO2: SpO2: (!) 80 % O2 Device: O2 Device: Nasal Cannula O2 Flow Rate: O2 Flow Rate (L/min): 3 L/min  Intake/output summary:  Intake/Output Summary (Last 24 hours) at 08/14/16 0915 Last data filed at 08/13/16 1620  Gross per 24 hour  Intake          1517.17 ml  Output              100 ml  Net          1417.17 ml   LBM: Last BM Date: 08/22/2016 Baseline Weight: Weight: 59 kg (130 lb) Most recent weight: Weight: 61 kg (134 lb 7.7 oz)       Palliative Assessment/Data: PPS 30%   Flowsheet Rows   Flowsheet Row Most Recent Value  Intake Tab  Referral Department  Hospitalist  Unit at Time of Referral  ICU  Palliative Care Primary Diagnosis  Cancer  Date Notified  08/09/2016  Palliative Care Type  Return patient Palliative Care  Reason for referral  Clarify Goals of Care, Counsel Regarding Hospice  Date of Admission  08/28/2016  Date first seen by Palliative Care  08/13/16  # of days IP prior to Palliative referral  0  Clinical Assessment  Palliative  Performance Scale Score  30%  Psychosocial & Spiritual Assessment  Palliative Care Outcomes  Patient/Family meeting held?  Yes  Who was at the meeting?  patient  Palliative Care Outcomes  Clarified goals of care, Provided psychosocial or spiritual support, ACP counseling assistance, Provided end of life care assistance, Counseled regarding hospice      Patient Active Problem List   Diagnosis Date Noted  . Healthcare-associated pneumonia   . Pleural effusion   . Palliative care by specialist   . DNR (do not resuscitate) discussion   . Goals of care, counseling/discussion   . Sepsis ([place]) 08/13/2016  . Chronic pain syndrome 04/23/2016  . Diabetes mellitus ([place]) 04/23/2016  . Drug-induced nausea and vomiting 12/27/2015  . Neurogenic pain 12/27/2015  . Deep vein thrombosis of portal vein 10/24/2015  . Adenocarcinoma of endometrium ([place]) 10/23/2015  . Moderate episode of recurrent major depressive disorder ([place]) 09/06/2015  . Liver cirrhosis ([place]) 09/06/2015  . Metastasis to spinal column ([place]) 09/06/2015  . Vitamin D deficiency 07/03/2015  . Malignancy ([place]) 06/14/2015  . Chronic viral hepatitis C ([place]) 06/14/2015  . Chronic kidney disease 06/14/2015  . Long term current use of opiate analgesic 06/14/2015  . Long term prescription opiate use 06/14/2015  . Opiate use (90 MME/Day) 06/14/2015  . Encounter for therapeutic drug level monitoring 06/14/2015  . Cancer-related pain 06/14/2015  . History of metastatic endometrial cancer (metastases to lymph nodes) 06/14/2015  . Chronic low back pain 06/14/2015  . Chronic neck pain 06/14/2015  . Chronic hip pain 06/14/2015  . Endometrial cancer ([place]) 06/14/2015  . Chronic pain of lower extremity 06/14/2015  . Chronic radicular pain of lower extremity 06/14/2015  . Arthropathy of hip 06/14/2015  . History of uterine cancer 06/14/2015  . Pain of metastatic malignancy 06/14/2015  . Other specified soft tissue disorders 04/04/2015  .  Malignant neoplasm of endometrium ([place]) 10/14/2013  . 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 Lumbar DDD 04/26/2013  . Bleeding gastrointestinal 04/26/2013  . BP (high blood pressure) 03/01/2013  . Hypertension 03/01/2013  . HCV (hepatitis C virus) 10/20/2012    Palliative Care Assessment & Plan   Patient Profile: 66 y.o. female  with past medical history of stage IV endometrial cancer, hepatitis C, liver cirrhosis, CKD III, obstructive nephropathy s/p left nephrostomy tube, hypertension, fibromyalgia, depression, DVT, arthritis, and anxiety admitted on 08/21/2016 with shortness of breath. In ED, found to have bilateral pleural effusions (R>L). Thoracentesis performed on 3/5 with 1.8 L off. Septic shock with hypotension, hypothermia, and tachycardia due to HCAP. Receiving zosyn. Stage IV endometrial adenocarcinoma-current hospice patient. Also with severe protein calorie malnutrition. Palliative medicine consultation for goals of care.   Assessment: Septic shock  Hypotension Pleural effusions Stage IV endometrial adenocarcinoma Liver cirrhosis due to hepatitis C Severe protein calorie malnutrition Acute on chronic respiratory failure Anemia   Recommendations/Plan:  Discussed and educated patient and grandson on importance of advanced directives and my recommendation for DNR/DNI with cancer, co-morbidities, and frailty. Patient wishes to remain a FULL code at this time. Grandson respects her wishes.  Encouraged them to continue conversations regarding EOL wishes as she continues to decline. Hard Choices copy left for review.  Hospice services to continue to follow at SNF at discharge.   PMT will continue to shadow chart and support patient/family through hospitalization.   Code Status: FULL   Code Status Orders        Start     Ordered   08/14/2016 1606  Full code  Continuous     08/31/2016 1607    Code Status History    Date Active Date Inactive Code Status Order ID Comments  User Context   This patient has a current code status but no historical code status.    Advance Directive Documentation   Flowsheet Row Most Recent Value  Type of Advance Directive  Healthcare Power of Attorney  Pre-existing out of facility DNR order (yellow form or pink MOST form)  No data  "MOST" Form in Place?  No data       Prognosis:   Unable to determine  Discharge Planning:  [place] with Hospice  Care plan was discussed with patient, grandson Academic librarian), and RN  Thank you for allowing the Palliative Medicine Team to assist in the care of this patient.   Time In: 0835 Time Out: 0910 Total Time 30min Prolonged Time Billed  no       Greater than 50%  of this time was spent counseling and coordinating care related to the above assessment and plan.  Ihor Dow, FNP-C Palliative Medicine Team  Phone: [phone] Fax: [phone]  Please contact Palliative Medicine Team phone at [phone] for questions and concerns.

## 2016-08-14 NOTE — Progress Notes (Signed)
Pharmacy Note   Pharmacy Consult for electrolyte monitoring/Zosyn Dosing  Pharmacy consulted for electrolyte management and Zosyn dosing for 66 yo female admitted with acute on chronic respiratory failure secondary to bilateral pleural effusions.   Plan:    1. Potassium 20 meq ordered x 1. Will f/u AM labs.  2. Continue Zosyn 3.375 g EI q 8 hours.    No Known Allergies  Patient Measurements: Height: 5\' 4"  (162.6 cm) Weight: 134 lb 7.7 oz (61 kg) IBW/kg (Calculated) : 54.7   Vital Signs: Temp: 97.2 F (36.2 C) (03/07 0800) Temp Source: Oral (03/07 0800) BP: 79/52 (03/07 1200) Pulse Rate: 120 (03/07 1200) Intake/Output from previous day: 03/06 0701 - 03/07 0700 In: 1517.2 [P.O.:240; I.V.:1277.2] Out: 100 [Urine:100] Intake/Output from this shift: Total I/O In: 150 [IV Piggyback:150] Out: -   Labs:  Recent Labs  09/04/2016 1326 08/13/16 0419 08/13/16 0830 08/14/16 0414  WBC 10.4 10.3  --  8.2  HGB 10.4* 9.6*  --  8.1*  HCT 30.8* 28.7*  --  23.7*  PLT 222 170  --  120*  CREATININE 0.99 1.13*  --  1.10*  MG  --   --  1.5* 2.6*  ALBUMIN 1.8*  --  1.6*  --   PROT 5.2*  --   --   --   AST 25  --   --   --   ALT 9*  --   --   --   ALKPHOS 73  --   --   --   BILITOT 1.5*  --   --   --    Estimated Creatinine Clearance: 44 mL/min (by C-G formula based on SCr of 1.1 mg/dL (H)).   Potassium  Date Value Ref Range Status  08/14/2016 3.4 (L) 3.5 - 5.1 mmol/L Final  12/26/2013 3.6 3.5 - 5.1 mmol/L Final   Antibiotics:  Levaquin 3/5 x 1 Zosyn 3/5 >> Vancomycin 3/5 >> 3/7  Microbiology: 3/5 MRSA PCR: negative 3/5 BCx: GPC (Staph species) 1/2 3/5 Pleural fluid cx: NGTD  Pharmacy will continue to monitor and adjust per consult.   Ulice Dash D 08/14/2016,12:28 PM

## 2016-08-14 NOTE — Progress Notes (Signed)
[facility] Progress Note Patient Name: Elizabeth Monroe DOB: 1950/10/05 MRN: [id number]   Date of Service  08/14/2016  HPI/Events of Note  GPC BC  eICU Interventions  Add vanc     Intervention Category Intermediate Interventions: Infection - evaluation and management  Raylene Miyamoto. 08/14/2016, 4:32 PM

## 2016-08-14 NOTE — Progress Notes (Signed)
Visit made. Patient seen sitting up in bed, now off vasopressors, oxygen on at 2 liters. Patient requesting orange juice. Assisted with opening yogurt. During visit patient brought up her desire to have physical therapy at [facility], this was not part of the original plan, she was to transfer as a long term care resident to [facility]. It is unclear as to whether patient will be able to participate in therapy. Dr. Benjie Karvonen in during visit.  Physical therapy evaluation pending. Patient voiced her understanding that she would have to revoke her hospice benefit if she were to discharge to short term rehab instead of long term care. Will continue to follow and update hospice team. [place] updated. Thank you. Flo Shanks RN, BSN, [facility] [place], hospital Liaison [phone] c

## 2016-08-14 NOTE — Progress Notes (Signed)
Pharmacy Antibiotic Note  Elizabeth Monroe is a 66 y.o. female admitted on 08/11/2016 with bacteremia.  Pharmacy has been consulted for Vancomycin dosing.  Plan: Patient was started on Vancomycin on 08/13/15.  Patient received 3 doses of Vancomycin with last dose on 3/6 at [facility].  Blood cx: BCID:Staph Species, MecA Positive in 2 of 4 bottles- Aerobic and Anaerobic. Will restart Vancomycin 750mg  Q18h per Dr. Titus Mould order. F/u culture results. Trough scheduled 3/9 at 0530. (Patient has gone almost 22 hrs since last dose)  Patient also on Zosyn EI 3.375gm IV q8h   Height: 5\' 4"  (162.6 cm) Weight: 134 lb 7.7 oz (61 kg) IBW/kg (Calculated) : 54.7  Temp (24hrs), Avg:97.5 F (36.4 C), Min:97.1 F (36.2 C), Max:97.9 F (36.6 C)   Recent Labs Lab 08/21/2016 1326 09/03/2016 1637 08/13/16 0317 08/13/16 0419 08/14/16 0414  WBC 10.4  --   --  10.3 8.2  CREATININE 0.99  --   --  1.13* 1.10*  LATICACIDVEN  --  2.8* 3.6*  --   --     Estimated Creatinine Clearance: 44 mL/min (by C-G formula based on SCr of 1.1 mg/dL (H)).    No Known Allergies  Antimicrobials this admission: Levaquin 3/5 x 1 Zosyn 3/5 >> Vancomycin 3/5 >> 3/7  Dose adjustments this admission:    Microbiology results: 3/5 MRSA PCR: negative 3/5 BCx: GPC (Staph species-MecA +) 2/4  3/5 Pleural fluid cx: NGTD   Thank you for allowing pharmacy to be a part of this patient's care.  [name] A 08/14/2016 5:29 PM

## 2016-08-14 NOTE — Progress Notes (Signed)
Name: Elizabeth Monroe MRN: [id number] DOB: 1950/12/13    ADMISSION DATE:  08/17/2016 CONSULTATION DATE:  003/06/2016  REFERRING MD :  Dr. Darvin Neighbours  CHIEF COMPLAINT:  Shortness of Breath and Sore throat  BRIEF PATIENT DESCRIPTION: 66 yo female with acute on chronic hypoxic respiratory failure secondary to bilateral pleural effusions right greater than left and ?HCAP.  Thoracentesis performed 03/5 with removal of 1.9L pleural fluid.  Transferred to [facility] Unit x2 due to recurrent hypotension and need for vasopressor therapy.  SIGNIFICANT EVENTS  03/5-Pt admitted to oncology unit with acute hypoxic respiratory failure secondary to bilateral pleural effusions and ?HCAP  03/5-Right sided thoracentesis performed by IR with removal of 1.9L of serous pleural fluid and laboratory samples obtained.  Pulmonology consulted 03/6-Pt transferred to [facility] Unit due to hypotension and potential requirement of vasopressors. 03/6-Pt transferred out of Stepdown Unit to [facility] floor PCCM signed off, however transferred back to [facility] Unit within 2hrs due to hypotension requiring brief period of vasopressors 03/7-PCCM initiated midodrine for hypotension   STUDIES:  CT CHEST 03/5>>Large right and small left pleural effusions. The right pleural effusion results in partial right lung collapse and mediastinal shift to the left. Right-sided thoracentesis or chest tube placement should be considered for diagnostic and therapeutic purposes. Patchy airspace opacities throughout the aerated left lung, greatest in the upper lobe, suspicious for pneumonia. Morphologic changes of hepatic cirrhosis.  REVIEW OF SYSTEMS:  Positives in BOLD  Constitutional: fever, chills, weight loss, malaise/fatigue, loss of appetite, and diaphoresis.  HENT: Negative for hearing loss, ear pain, nosebleeds, congestion, sore throat, neck pain, tinnitus and ear discharge.   Eyes: Negative for blurred vision, double vision, photophobia, pain,  discharge and redness.  Respiratory: cough, hemoptysis, sputum production, shortness of breath, wheezing and stridor.   Cardiovascular: chest pain, palpitations, orthopnea, claudication, leg swelling and PND.  Gastrointestinal: Negative for heartburn, nausea, vomiting, abdominal pain, diarrhea, constipation, blood in stool and melena.  Genitourinary: Negative for dysuria, urgency, frequency, hematuria and flank pain.  Musculoskeletal: Negative for myalgias, back pain, joint pain and falls.  Skin: Negative for itching and rash.  Neurological: dizziness, tingling, tremors, sensory change, speech change, focal weakness, seizures, loss of consciousness, weakness and headaches.  Endo/Heme/Allergies: Negative for environmental allergies and polydipsia. Does not bruise/bleed easily.  SUBJECTIVE:   Pt states she is slightly short of breath on 3L O2 via nasal canula and continues to complain of weakness.   VITAL SIGNS: Temp:  [97.1 F (36.2 C)-97.8 F (36.6 C)] 97.2 F (36.2 C) (03/07 0800) Pulse Rate:  [90-120] 120 (03/07 1030) Resp:  [14-29] 24 (03/07 1030) BP: (74-94)/(41-66) 86/58 (03/07 1030) SpO2:  [80 %-100 %] 100 % (03/07 1030) Weight:  [60.7 kg (133 lb 12.8 oz)-61 kg (134 lb 7.7 oz)] 61 kg (134 lb 7.7 oz) (03/07 0503)  PHYSICAL EXAMINATION: General: severely cachectic frail appearing Caucasian female  Neuro: alert and oriented, follows commands PERRLA HEENT: supple, no JVD Cardiovascular: sinus tach-, s1s2, no M/R/G, right sided chest portacath  Lungs: faint crackles bilateral bases diminished throughout, even, non labored  Abdomen: +BS x4, soft, non tender, non distended  Musculoskeletal: cachectic, 2+ bilateral pitting lower extremity edema Skin: scattered ecchymosis, skin tear left forearm dressing dry and intact   Recent Labs Lab 08/12/16 1326 08/13/16 0419 08/14/16 0414  NA 134* 136 136  K 3.6 3.3* 3.4*  CL 102 104 106  CO2 24 22 23   BUN 35* 34* 33*  CREATININE 0.99  1.13* 1.10*  GLUCOSE 163*  144* 116*    Recent Labs Lab 09/04/2016 1326 08/13/16 0419 08/14/16 0414  HGB 10.4* 9.6* 8.1*  HCT 30.8* 28.7* 23.7*  WBC 10.4 10.3 8.2  PLT 222 170 120*   Dg Chest 1 View  Result Date: 09/05/2016 CLINICAL DATA:  Post right-sided thoracentesis. EXAM: CHEST 1 VIEW COMPARISON:  Chest radiograph-earlier same day ; chest CT - earlier same day FINDINGS: Grossly unchanged cardiac silhouette and mediastinal contours. Stable position of support apparatus. Interval reduction in persistent small right-sided effusion post thoracentesis. Improved aeration of the right mid and lower lung with persistent bibasilar heterogeneous opacities, right greater than left. Unchanged trace left-sided pleural effusion. Ill-defined heterogeneous opacities about the left hilum are unchanged. No new focal airspace opacities. No pneumothorax. No evidence of edema. Multiple embolization coils are seen overlying the left upper abdominal quadrant. Additional tubing overlies the left upper abdominal quadrant. IMPRESSION: 1. Interval reduction in persistent small right-sided effusion post thoracentesis. No pneumothorax. 2. Otherwise, stable abnormal examination as above. Electronically Signed   By: Sandi Mariscal M.D.   On: 08/27/2016 17:25   Dg Chest 2 View  Result Date: 08/08/2016 CLINICAL DATA:  Respiratory symptoms for 2-3 days. Patient being treated with chemotherapy for endometrial carcinoma. EXAM: CHEST  2 VIEW COMPARISON:  December 27, 2013 FINDINGS: A right Port-A-Cath terminates in good position. There is a moderate right-sided pleural effusion with underlying opacity. A smaller left effusion is identified. Patchy opacity is seen in the left mid lung with nodular components. One of the apparent nodular components is lucent centrally suggesting the possibility of central cavitation. No other interval changes or acute abnormalities. IMPRESSION: 1. Patchy nodular opacities in the left mid lung. One of these  nodular opacities demonstrates central lucency suggesting the possibility of cavitation. The findings could represent an infectious or neoplastic process given history. Recommend clinical correlation and follow-up to resolution. 2. Moderate right and small left effusions with underlying opacities. Electronically Signed   By: Dorise Bullion III M.D   On: 08/30/2016 14:29   Ct Chest Wo Contrast  Result Date: 09/07/2016 CLINICAL DATA:  Respiratory symptoms for 2 or 3 days. Started with sore throat, now shortness of breath. Being treated with chemotherapy for metastatic endometrial cancer. EXAM: CT CHEST WITHOUT CONTRAST TECHNIQUE: Multidetector CT imaging of the chest was performed following the standard protocol without IV contrast. COMPARISON:  Chest radiographs today. Abdominal CT 06/19/2010. No previous chest CT. FINDINGS: Cardiovascular: Mild aortic and coronary artery atherosclerosis. No acute vascular findings are demonstrated on noncontrast imaging. There is a right IJ central venous catheter with its tip in the mid SVC. The heart size is normal. There may be a small amount of pericardial fluid anteriorly. Mediastinum/Nodes: The mediastinum is shifted into the left hemithorax. No enlarged mediastinal, hilar or axillary lymph nodes are seen, although hilar evaluation is limited by the lack of intravenous contrast. The thyroid gland, trachea and esophagus demonstrate no significant findings. Lungs/Pleura: There is a very large pleural effusion on the right with resulting mediastinal shift to the left. There is a small dependent left pleural effusion. The right lower lobe is completely collapsed. There is dependent atelectasis in the right upper and middle lobes. There are patchy perihilar airspace opacities throughout the left lung, greatest in the upper lobe. No discrete pulmonary nodules are seen. Upper abdomen: The liver is shrunken with diffuse contour irregularity, consistent with advanced cirrhosis.  Grossly stable lobularity of the spleen status post previous embolization procedure. Musculoskeletal/Chest wall: There is no chest wall  mass or suspicious osseous finding. There are scattered small sclerotic lesions in the spine which are grossly stable. IMPRESSION: 1. Large right and small left pleural effusions. The right pleural effusion results in partial right lung collapse and mediastinal shift to the left. Right-sided thoracentesis or chest tube placement should be considered for diagnostic and therapeutic purposes. 2. Patchy airspace opacities throughout the aerated left lung, greatest in the upper lobe, suspicious for pneumonia. 3. Morphologic changes of hepatic cirrhosis. Electronically Signed   By: Richardean Sale M.D.   On: 08/24/2016 15:23   Dg Chest Port 1 View  Result Date: 08/13/2016 CLINICAL DATA:  Acute respiratory failure EXAM: PORTABLE CHEST 1 VIEW COMPARISON:  08/23/2016 FINDINGS: Right jugular port extends to the cavoatrial junction. Moderate right pleural effusion persists without significant change. Patchy multifocal airspace opacities persist. No pneumothorax. IMPRESSION: No significant interval change in the bilateral airspace opacities. Persistent right pleural effusion. Electronically Signed   By: Andreas Newport M.D.   On: 08/13/2016 04:36   US Thoracentesis Asp Pleural Space W/img Guide  Result Date: 08/24/2016 INDICATION: History of malignancy and cirrhosis, now with symptomatic right-sided sided pleural effusion. Please perform ultrasound-guided thoracentesis for diagnostic and therapeutic purposes EXAM: US THORACENTESIS ASP PLEURAL SPACE W/IMG GUIDE COMPARISON:  Chest radiograph - earlier same day ; chest CT - earlier same day MEDICATIONS: None. COMPLICATIONS: None immediate. TECHNIQUE: Informed written consent was obtained from the patient after a discussion of the risks, benefits and alternatives to treatment. A timeout was performed prior to the initiation of the  procedure. With the patient positioned left lateral decubitus, initial ultrasound scanning demonstrates a large right-sided pleural effusion. The right lateral lower chest was prepped and draped in the usual sterile fashion. 1% lidocaine was used for local anesthesia. An ultrasound image was saved for documentation purposes. An 8 Fr Safe-T-Centesis catheter was introduced. The thoracentesis was performed. The catheter was removed and a dressing was applied. The patient tolerated the procedure well without immediate post procedural complication. The patient was escorted to have an upright chest radiograph. FINDINGS: A total of approximately 1.9 liters of serous fluid was removed. Requested samples were sent to the laboratory. IMPRESSION: Successful ultrasound-guided right sided thoracentesis yielding 1.9 liters of pleural fluid. Electronically Signed   By: Sandi Mariscal M.D.   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   Marda Stalker, [place] Pager [phone] (please enter 7 digits) PCCM Consult Pager [phone] (please enter 7 digits)  PCCM ATTENDING: I evaluated the patient with Wess Botts and have reviewed the above note. I agree with the findings, assessment and plan. Since Mrs. Bernette Mayers has bounced back  and forth in and out of the intensive care unit, we will watch her here through today. Initiate midodrine and she may be transferred out of the ICU tomorrow if she no longer requires vasopressors. I encouraged continued adressing of goals  of care. She has a very poor candidate for ACLS or mechanical ventilation.  Merton Border, MD PCCM service Mobile [phone] Pager [phone] 08/14/2016

## 2016-08-14 NOTE — Progress Notes (Signed)
*  PRELIMINARY RESULTS* Echocardiogram 2D Echocardiogram has been performed.  Elizabeth Monroe 08/14/2016, 1:34 PM

## 2016-08-14 NOTE — Progress Notes (Signed)
Patient started on midodrine, BP improved throughout shift. Patient decreased from 3L to 2L of oxygen, saturations stable. Patient has had no complaints of pain today. Patient was able to get up with 2 assist to [facility], only able to only do about 25% of work. Resting comfortably in bed now.  Wilnette Kales

## 2016-08-15 ENCOUNTER — Inpatient Hospital Stay

## 2016-08-15 DIAGNOSIS — R918 Other nonspecific abnormal finding of lung field: Secondary | ICD-10-CM

## 2016-08-15 DIAGNOSIS — Z7189 Other specified counseling: Secondary | ICD-10-CM

## 2016-08-15 DIAGNOSIS — J9600 Acute respiratory failure, unspecified whether with hypoxia or hypercapnia: Secondary | ICD-10-CM

## 2016-08-15 DIAGNOSIS — R0602 Shortness of breath: Secondary | ICD-10-CM

## 2016-08-15 DIAGNOSIS — E43 Unspecified severe protein-calorie malnutrition: Secondary | ICD-10-CM

## 2016-08-15 LAB — CULTURE, BLOOD (ROUTINE X 2)

## 2016-08-15 LAB — COMPREHENSIVE METABOLIC PANEL
ALBUMIN: 2.3 g/dL — AB (ref 3.5–5.0)
ALT: 8 U/L — ABNORMAL LOW (ref 14–54)
AST: 28 U/L (ref 15–41)
Alkaline Phosphatase: 66 U/L (ref 38–126)
Anion gap: 10 (ref 5–15)
BUN: 42 mg/dL — AB (ref 6–20)
CHLORIDE: 105 mmol/L (ref 101–111)
CO2: 17 mmol/L — ABNORMAL LOW (ref 22–32)
Calcium: 7.7 mg/dL — ABNORMAL LOW (ref 8.9–10.3)
Creatinine, Ser: 1.34 mg/dL — ABNORMAL HIGH (ref 0.44–1.00)
GFR calc Af Amer: 47 mL/min — ABNORMAL LOW (ref >60)
GFR, EST NON AFRICAN AMERICAN: 41 mL/min — AB (ref >60)
GLUCOSE: 149 mg/dL — AB (ref 65–99)
POTASSIUM: 4.3 mmol/L (ref 3.5–5.1)
SODIUM: 132 mmol/L — AB (ref 135–145)
Total Bilirubin: 1.6 mg/dL — ABNORMAL HIGH (ref 0.3–1.2)
Total Protein: 4.8 g/dL — ABNORMAL LOW (ref 6.5–8.1)

## 2016-08-15 LAB — CBC WITH DIFFERENTIAL/PLATELET
BASOS ABS: 0 10*3/uL (ref 0–0.1)
BASOS PCT: 0 %
EOS PCT: 0 %
Eosinophils Absolute: 0 10*3/uL (ref 0–0.7)
HCT: 27.8 % — ABNORMAL LOW (ref 35.0–47.0)
Hemoglobin: 9.1 g/dL — ABNORMAL LOW (ref 12.0–16.0)
Lymphocytes Relative: 8 %
Lymphs Abs: 1.2 10*3/uL (ref 1.0–3.6)
MCH: 32.8 pg (ref 26.0–34.0)
MCHC: 32.7 g/dL (ref 32.0–36.0)
MCV: 100.3 fL — ABNORMAL HIGH (ref 80.0–100.0)
MONO ABS: 0.5 10*3/uL (ref 0.2–0.9)
Monocytes Relative: 3 %
Neutro Abs: 13.4 10*3/uL — ABNORMAL HIGH (ref 1.4–6.5)
Neutrophils Relative %: 89 %
PLATELETS: 152 10*3/uL (ref 150–440)
RBC: 2.77 MIL/uL — ABNORMAL LOW (ref 3.80–5.20)
RDW: 19.6 % — AB (ref 11.5–14.5)
WBC: 15.1 10*3/uL — ABNORMAL HIGH (ref 3.6–11.0)

## 2016-08-15 LAB — MAGNESIUM: MAGNESIUM: 2.4 mg/dL (ref 1.7–2.4)

## 2016-08-15 LAB — PROCALCITONIN: PROCALCITONIN: 0.58 ng/mL

## 2016-08-15 LAB — PHOSPHORUS: Phosphorus: 2.8 mg/dL (ref 2.5–4.6)

## 2016-08-15 MED ORDER — ACETAMINOPHEN 325 MG PO TABS: 650.0000 mg | ORAL_TABLET | Freq: Four times a day (QID) | ORAL | Status: DC | PRN

## 2016-08-15 MED ORDER — MORPHINE BOLUS VIA INFUSION
4.0000 mg | INTRAVENOUS | Status: DC | PRN
  Administered 2016-08-15: 4 mg via INTRAVENOUS
  Filled 2016-08-15: qty 4

## 2016-08-15 MED ORDER — POLYETHYLENE GLYCOL 3350 17 G PO PACK: 17.0000 g | PACK | Freq: Every day | ORAL | Status: DC

## 2016-08-15 MED ORDER — HALOPERIDOL 0.5 MG PO TABS
0.5000 mg | ORAL_TABLET | ORAL | Status: DC | PRN
  Filled 2016-08-15: qty 1

## 2016-08-15 MED ORDER — MORPHINE 100MG IN NS 100ML (1MG/ML) PREMIX INFUSION
5.0000 mg/h | INTRAVENOUS | Status: DC
  Administered 2016-08-15: 5 mg/h via INTRAVENOUS
  Filled 2016-08-15: qty 100

## 2016-08-15 MED ORDER — ONDANSETRON 4 MG PO TBDP: 4.0000 mg | ORAL_TABLET | Freq: Four times a day (QID) | ORAL | Status: DC | PRN

## 2016-08-15 MED ORDER — ACETAMINOPHEN 650 MG RE SUPP: 650.0000 mg | Freq: Four times a day (QID) | RECTAL | Status: DC | PRN

## 2016-08-15 MED ORDER — MORPHINE SULFATE (PF) 4 MG/ML IV SOLN
1.0000 mg | Freq: Once | INTRAVENOUS | Status: AC
  Administered 2016-08-15: 1 mg via INTRAVENOUS
  Filled 2016-08-15: qty 1

## 2016-08-15 MED ORDER — GLYCOPYRROLATE 0.2 MG/ML IJ SOLN: 0.2000 mg | INTRAMUSCULAR | Status: DC | PRN

## 2016-08-15 MED ORDER — METHYLPREDNISOLONE SODIUM SUCC 40 MG IJ SOLR
40.0000 mg | INTRAMUSCULAR | Status: DC
  Administered 2016-08-15: 40 mg via INTRAVENOUS
  Filled 2016-08-15: qty 1

## 2016-08-15 MED ORDER — HALOPERIDOL LACTATE 5 MG/ML IJ SOLN
0.5000 mg | INTRAMUSCULAR | Status: DC | PRN
Start: 2016-08-15 — End: 2016-08-16

## 2016-08-15 MED ORDER — POLYVINYL ALCOHOL 1.4 % OP SOLN
1.0000 [drp] | Freq: Four times a day (QID) | OPHTHALMIC | Status: DC | PRN
  Filled 2016-08-15: qty 15

## 2016-08-15 MED ORDER — ALBUTEROL SULFATE (2.5 MG/3ML) 0.083% IN NEBU: 2.5000 mg | INHALATION_SOLUTION | RESPIRATORY_TRACT | Status: DC | PRN

## 2016-08-15 MED ORDER — FERROUS SULFATE 220 (44 FE) MG/5ML PO ELIX: 220.0000 mg | ORAL_SOLUTION | Freq: Every day | ORAL | Status: DC

## 2016-08-15 MED ORDER — HALOPERIDOL LACTATE 2 MG/ML PO CONC
0.5000 mg | ORAL | Status: DC | PRN
  Filled 2016-08-15: qty 0.3

## 2016-08-15 MED ORDER — ONDANSETRON HCL 4 MG/2ML IJ SOLN: 4.0000 mg | Freq: Four times a day (QID) | INTRAMUSCULAR | Status: DC | PRN

## 2016-08-15 MED ORDER — ENSURE ENLIVE PO LIQD
237.0000 mL | Freq: Three times a day (TID) | ORAL | Status: DC
  Administered 2016-08-15 (×2): 237 mL via ORAL

## 2016-08-15 MED ORDER — GLYCOPYRROLATE 1 MG PO TABS: 1.0000 mg | ORAL_TABLET | ORAL | Status: DC | PRN

## 2016-08-16 LAB — URINE CULTURE: Culture: NO GROWTH

## 2016-08-16 LAB — BODY FLUID CULTURE: CULTURE: NO GROWTH

## 2016-08-17 LAB — CULTURE, BLOOD (ROUTINE X 2): CULTURE: NO GROWTH

## 2016-09-08 NOTE — Progress Notes (Signed)
Passed away at 2038.  Pronounced by this RN and Laurin Coder, RN.  Next of kin notified.  Hydrographic surveyor notified.  MD notified.  [facility] called.  Pt is not candidate.

## 2016-09-08 NOTE — Progress Notes (Signed)
Visit made. Patient now on Hi flo oxygen due to decreased oxygen saturations this morning. Patient seen lying in bed, appears pale, much less alert and interactive than at visit yesterday. She is currently off vasopressors, started on Midodrine oral yesterday. Patient began grunting and groaning, when asked about pain, she denied and reported she was having bowel movement and requested writer to leave. Staff RN Brandi aware. Per chart note review and discussion with Palliative NP Ihor Dow, patient's grandson will be coming to the hospital tomorrow morning for a discussion regarding his grand mother's decline and code status. Patient continues on IV antibiotics and steroids. Will continue to follow and update hospice team. Flo Shanks RN, BSN, [place] [facility] liaison [phone]

## 2016-09-08 NOTE — Progress Notes (Signed)
Name: Elizabeth Monroe MRN: [id number] DOB: Aug 08, 1950    ADMISSION DATE:  08/29/2016 CONSULTATION DATE:  08/12/2016  REFERRING MD :  Dr. Darvin Neighbours  CHIEF COMPLAINT:  Shortness of Breath and Sore throat  BRIEF PATIENT DESCRIPTION: 66 yo female with acute on chronic hypoxic respiratory failure secondary to bilateral pleural effusions right greater than left and ?HCAP.  Thoracentesis performed 03/5 with removal of 1.9L pleural fluid.  Transferred to [facility] Unit x2 due to recurrent hypotension and need for vasopressor therapy.  SIGNIFICANT EVENTS  03/05-Pt admitted to oncology unit with acute hypoxic respiratory failure secondary to bilateral pleural effusions and ?HCAP  03/05-Right sided thoracentesis performed by IR with removal of 1.9L of serous pleural fluid and laboratory samples obtained.  Pulmonology consulted 03/06-Pt transferred to [facility] Unit due to hypotension and potential requirement of vasopressors. 03/06-Pt transferred out of Stepdown Unit to [facility] floor PCCM signed off, however transferred back to [facility] Unit within 2hrs due to hypotension requiring brief period of vasopressors 03/07-PCCM initiated midodrine for hypotension  03/08 worsening hypoxemia requiring HFNC O2. Pt unwilling to address [place].  STUDIES:  CT CHEST 03/5: Large right and small left pleural effusions. The right pleural effusion results in partial right lung collapse and mediastinal shift to the left. Right-sided thoracentesis or chest tube placement should be considered for diagnostic and therapeutic purposes. Patchy airspace opacities throughout the aerated left lung, greatest in the upper lobe, suspicious for pneumonia. Morphologic changes of hepatic cirrhosis.   SUBJECTIVE:  worsening hypoxemia requiring HFNC O2. Pt unwilling to address [place]. "+" BC is a contaminant  VITAL SIGNS: Temp:  [96.4 F (35.8 C)-98.1 F (36.7 C)] 97.6 F (36.4 C) (03/08 1200) Pulse Rate:  [104-131] 121 (03/08 1200) Resp:   [21-40] 36 (03/08 1200) BP: (73-116)/(45-79) 84/66 (03/08 1200) SpO2:  [86 %-100 %] 92 % (03/08 1432) FiO2 (%):  [80 %-98 %] 80 % (03/08 1432)  PHYSICAL EXAMINATION: General: severely cachectic frail appearing Caucasian female  Neuro: alert and oriented, follows commands PERRLA HEENT: supple, no JVD Cardiovascular: sinus tach-, s1s2, no M/R/G, right sided chest portacath  Lungs: faint crackles bilateral bases diminished throughout, even, non labored  Abdomen: +BS x4, soft, non tender, non distended  Musculoskeletal: cachectic, 2+ bilateral pitting lower extremity edema Skin: scattered ecchymosis, skin tear left forearm dressing dry and intact   Recent Labs Lab 08/13/16 0419 08/14/16 0414 09/08/2016 0404  NA 136 136 132*  K 3.3* 3.4* 4.3  CL 104 106 105  CO2 22 23 17*  BUN 34* 33* 42*  CREATININE 1.13* 1.10* 1.34*  GLUCOSE 144* 116* 149*    Recent Labs Lab 08/13/16 0419 08/14/16 0414 08-Sep-2016 0404  HGB 9.6* 8.1* 9.1*  HCT 28.7* 23.7* 27.8*  WBC 10.3 8.2 15.1*  PLT 170 120* 152   Dg Chest Port 1 View  Result Date: 09-08-2016 CLINICAL DATA:  Acute respiratory failure EXAM: PORTABLE CHEST 1 VIEW COMPARISON:  08/13/2016 FINDINGS: Progression of diffuse bilateral airspace disease. Moderately large right pleural effusion and right lower lobe airspace disease unchanged. Minimal left effusion. Port-A-Cath tip in the SVC unchanged. IMPRESSION: Progression of extensive bilateral airspace disease. Moderate right effusion and right lower lobe airspace disease unchanged. Electronically Signed   By: Franchot Gallo M.D.   On: September 08, 2016 07:33    ASSESSMENT / PLAN: Extensive metastatic endometrial cancer Acute hypoxic respiratory failure  Bilateral pulmonary infiltrates Bilateral recurrent pleural effusions right greater than left   s/p right thoracentesis 08/12/16 with removal 1.9L pleural fluid Protein-calorie malnutrition  with hypoalbuminemia  Profound deconditioning Very poor  prognosis P: Continue supplemental oxygen to maintain oxygen saturations greater than 92% Continue empiric antibiotics Continue to push nutrition I tried to discuss goals of care with her but she is very resistant to addressing this topic expressing a hope that she might be cured miraculously Will try to meet with her and her grandson (who is healthcare power of attorney) again 03/09   Merton Border, MD PCCM service Mobile [phone] Pager [phone] Aug 19, 2016

## 2016-09-08 NOTE — Progress Notes (Signed)
[facility] [place] NAME: Elizabeth Monroe    MR#:  [id number]  DATE OF BIRTH:  11-Dec-1950  SUBJECTIVE:  Patient With increased work of breathing yesterday and now on high flow oxygen. Chest x-ray shows progressive pneumonia. Patient continues to state she would like to be full code.  REVIEW OF SYSTEMS:    Review of Systems  Constitutional: Positive for malaise/fatigue. Negative for chills and fever.  HENT: Negative.  Negative for ear discharge, ear pain, hearing loss, nosebleeds and sore throat.   Eyes: Negative.  Negative for blurred vision and pain.  Respiratory: Positive for shortness of breath. Negative for cough, hemoptysis and wheezing.   Cardiovascular: Negative.  Negative for chest pain, palpitations and leg swelling.  Gastrointestinal: Negative.  Negative for abdominal pain, blood in stool, diarrhea, nausea and vomiting.  Genitourinary: Negative.  Negative for dysuria.  Musculoskeletal: Negative.  Negative for back pain.  Skin: Negative.   Neurological: Positive for weakness. Negative for dizziness, tremors, speech change, focal weakness, seizures and headaches.  Endo/Heme/Allergies: Negative.  Does not bruise/bleed easily.  Psychiatric/Behavioral: Negative.  Negative for depression, hallucinations and suicidal ideas.    Tolerating Diet: yes      DRUG ALLERGIES:  No Known Allergies  VITALS:  Blood pressure 116/79, pulse (!) 123, temperature 97.8 F (36.6 C), temperature source Oral, resp. rate (!) 39, height 5\' 4"  (1.626 m), weight 61 kg (134 lb 7.7 oz), SpO2 99 %.  PHYSICAL EXAMINATION:   Physical Exam  Constitutional: She is oriented to person, place, and time. No distress.  Frail, thin  HENT:  Head: Normocephalic.  Eyes: No scleral icterus.  Neck: Normal range of motion. Neck supple. No JVD present. No tracheal deviation present.  Cardiovascular: Normal rate, regular rhythm and normal heart sounds.  Exam reveals no  gallop and no friction rub.   No murmur heard. Pulmonary/Chest: No respiratory distress. She has wheezes. She has rales. She exhibits no tenderness.  Increased respiratory rate  Abdominal: Soft. Bowel sounds are normal. She exhibits no distension and no mass. There is no tenderness. There is no rebound and no guarding.  Musculoskeletal: Normal range of motion. She exhibits no edema.  Neurological: She is alert and oriented to person, place, and time.  Skin: Skin is warm. No rash noted. No erythema.  Psychiatric: Affect and judgment normal.      LABORATORY PANEL:   CBC  Recent Labs Lab 09-02-2016 0404  WBC 15.1*  HGB 9.1*  HCT 27.8*  PLT 152   ------------------------------------------------------------------------------------------------------------------  Chemistries   Recent Labs Lab Sep 02, 2016 0404  NA 132*  K 4.3  CL 105  CO2 17*  GLUCOSE 149*  BUN 42*  CREATININE 1.34*  CALCIUM 7.7*  MG 2.4  AST 28  ALT 8*  ALKPHOS 66  BILITOT 1.6*   ------------------------------------------------------------------------------------------------------------------  Cardiac Enzymes  Recent Labs Lab 08/19/2016 1326  TROPONINI <0.03   ------------------------------------------------------------------------------------------------------------------  RADIOLOGY:  Dg Chest Port 1 View  Result Date: 2016/09/02 CLINICAL DATA:  Acute respiratory failure EXAM: PORTABLE CHEST 1 VIEW COMPARISON:  08/13/2016 FINDINGS: Progression of diffuse bilateral airspace disease. Moderately large right pleural effusion and right lower lobe airspace disease unchanged. Minimal left effusion. Port-A-Cath tip in the SVC unchanged. IMPRESSION: Progression of extensive bilateral airspace disease. Moderate right effusion and right lower lobe airspace disease unchanged. Electronically Signed   By: Franchot Gallo M.D.   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   1. Septic shock with hypotension, hypothermia and tachycardia as well as lactic acidosis due to HCAP: Continue Zosyn for total 7 days MRSA PCR was negative Blood culture 1 out of 2 bottles coag-negative staph Continue Midodrine Continue taking Hold BP meds due to low BP CBC slightly elevated this morning will follow, may ned to change to cefepime   2. Pleural effusion status post thoracentesis with 1.9L seroud fluid removed 3. 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 Liver cirrhosis due to hepatitis C: Due to hypotension Aldactone and Lasix are on hold  5. Severe protein calorie malnutrition: Dietary consult appreciated  6. Hypokalemia/hypocalcemia/hypomagnesia: repleting per pharmacy  7. Acute on Chronic respiratory failure on 2 L of oxygen due to pleural effusions and HCAP Start IV steroids  8. Severe iron deficiency Anemia, acute on chronic: Anemia panel shows iron of 15 and ferritin of 65 Start ferrous sulfate 9. Hyponatremia: Repeat BMP in a.m.     Management plans discussed with the patient and she is in agreement.  CODE STATUS: FULL   critical care TOTAL TIME TAKING CARE OF THIS PATIENT:27  minutes.  High risk of intubation given increasing o2 requirements   POSSIBLE D/C 2 days, DEPENDING ON CLINICAL CONDITION.   Scotti Kosta M.D on 08-20-2016 at 11:29 AM  Between 7am to 6pm - Pager - [phone] After 6pm go to [email or web address] - password [email or web address] [facility]  Office  [phone]  CC: Primary care physician; No PCP  Per Patient  Note: This dictation was prepared with Dragon dictation along with smaller phrase technology. Any transcriptional errors that result from this process are unintentional.

## 2016-09-08 NOTE — Progress Notes (Signed)
Patient's respiratory status continued to decline during the day, HFNC was placed after patient refused bipap and requested different o2 delivery method than non rebreather. Oxygen saturations improved, however work of breathing continued to get worse. Dr. Ashby Dawes and Hinton Dyer, NP notified of continued decline. Patient made comfort care by MD after family discussion.  Wilnette Kales

## 2016-09-08 NOTE — Progress Notes (Signed)
Pharmacy Note   Pharmacy Consult for electrolyte monitoring/Zosyn Dosing  Pharmacy consulted for electrolyte management and Zosyn dosing for 65 yo female admitted with acute on chronic respiratory failure secondary to bilateral pleural effusions.   Plan:    1. Electrolytes WNL. Will f/u AM labs.  2. Continue Zosyn 3.375 g EI q 8 hours.    No Known Allergies  Patient Measurements: Height: 5\' 4"  (162.6 cm) Weight: 134 lb 7.7 oz (61 kg) IBW/kg (Calculated) : 54.7   Vital Signs: Temp: 97.6 F (36.4 C) (03/08 1200) Temp Source: Rectal (03/08 1200) BP: 84/66 (03/08 1200) Pulse Rate: 121 (03/08 1200) Intake/Output from previous day: 03/07 0701 - 03/08 0700 In: 386.1 [I.V.:36.1; IV Piggyback:350] Out: 180 [Urine:180] Intake/Output from this shift: No intake/output data recorded.  Labs:  Recent Labs  08/13/16 0419 08/13/16 0830 08/14/16 0414 2016-08-31 0404  WBC 10.3  --  8.2 15.1*  HGB 9.6*  --  8.1* 9.1*  HCT 28.7*  --  23.7* 27.8*  PLT 170  --  120* 152  CREATININE 1.13*  --  1.10* 1.34*  MG  --  1.5* 2.6* 2.4  PHOS  --   --   --  2.8  ALBUMIN  --  1.6*  --  2.3*  PROT  --   --   --  4.8*  AST  --   --   --  28  ALT  --   --   --  8*  ALKPHOS  --   --   --  66  BILITOT  --   --   --  1.6*   Estimated Creatinine Clearance: 36.1 mL/min (by C-G formula based on SCr of 1.34 mg/dL (H)).   Potassium  Date Value Ref Range Status  08-31-2016 4.3 3.5 - 5.1 mmol/L Final  12/26/2013 3.6 3.5 - 5.1 mmol/L Final   Antibiotics:  Levaquin 3/5 x 1 Zosyn 3/5 >> Vancomycin 3/5 >> 3/7  Microbiology: 3/5 MRSA PCR: negative 3/5 BCx: CNS 1/2 3/5 Pleural fluid cx: NGTD 3/7 UCx: pending  Pharmacy will continue to monitor and adjust per consult.   Ulice Dash D 2016/08/31,2:05 PM

## 2016-09-08 NOTE — Progress Notes (Addendum)
Returned to check on patient as she has continued to decline. Elizabeth Monroe, church friend Butch Penny and daughter present, patient appeared to have labored breathing, remains on [place] oxygen. With increased respiratory rate. Staff RN Velna Hatchet and NP Hinton Dyer aware, PRn medication to be given. Patient requested ginger ale, ate some ice chips, but appeared to be much less oriented than at visit earlier. Elizabeth Monroe and her church friend Butch Penny are aware of the critical nature of her condition. Emotional support provided. Hospice team updated. Flo Shanks RN, BSN, [facility] [place], hospital liaison [phone] c

## 2016-09-08 NOTE — Discharge Summary (Signed)
DEATH SUMMARY   Patient Details  Name: Elizabeth Monroe MRN: [id number] DOB: 1950/06/27  Admission/Discharge Information   Admit Date:  09/06/16  Date of Death: Date of Death: 2016/09/09  Time of Death: Time of Death: 2036/10/01  Length of Stay: 3  Referring Physician: No PCP Per Patient   Reason(s) for Hospitalization    Diagnoses  Preliminary cause of death:  Secondary Diagnoses (including complications and co-morbidities):  Active Problems:   Sepsis ([place])   Healthcare-associated pneumonia   Pleural effusion   Palliative care by specialist   DNR (do not resuscitate) discussion   Goals of care, counseling/discussion   Protein-calorie malnutrition, severe   Hypotension   SOB (shortness of breath)   Acute respiratory failure [facility])   Pulmonary infiltrates   Brief Hospital Course (including significant findings, care, treatment, and services provided and events leading to death)  66 year old female with stage IV endometrial carcinoma with metastatic disease not on treatment followed by hospice, 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 Her respiratory status continues to decline throughout the day on Sep 10, 2022 due to underlying HCAP and end stage terminal disease. ICU team spoke with patient's son about er decline and she was made comfort care and pronounced the same day on 09-09-16 at 10-01-36.  Pertinent Labs and Studies  Significant Diagnostic Studies Dg Chest 1 View  Result Date: 09-06-2016 CLINICAL DATA:  Post right-sided thoracentesis. EXAM: CHEST 1 VIEW COMPARISON:  Chest radiograph-earlier same day ; chest CT - earlier same day FINDINGS: Grossly unchanged cardiac silhouette and mediastinal contours. Stable position of support apparatus. Interval reduction in persistent small right-sided effusion post thoracentesis. Improved aeration of the right mid and lower lung with persistent  bibasilar heterogeneous opacities, right greater than left. Unchanged trace left-sided pleural effusion. Ill-defined heterogeneous opacities about the left hilum are unchanged. No new focal airspace opacities. No pneumothorax. No evidence of edema. Multiple embolization coils are seen overlying the left upper abdominal quadrant. Additional tubing overlies the left upper abdominal quadrant. IMPRESSION: 1. Interval reduction in persistent small right-sided effusion post thoracentesis. No pneumothorax. 2. Otherwise, stable abnormal examination as above. Electronically Signed   By: Sandi Mariscal M.D.   On: 09-06-16 17:25   Dg Chest 2 View  Result Date: 09/06/2016 CLINICAL DATA:  Respiratory symptoms for 2-3 days. Patient being treated with chemotherapy for endometrial carcinoma. EXAM: CHEST  2 VIEW COMPARISON:  December 27, 2013 FINDINGS: A right Port-A-Cath terminates in good position. There is a moderate right-sided pleural effusion with underlying opacity. A smaller left effusion is identified. Patchy opacity is seen in the left mid lung with nodular components. One of the apparent nodular components is lucent centrally suggesting the possibility of central cavitation. No other interval changes or acute abnormalities. IMPRESSION: 1. Patchy nodular opacities in the left mid lung. One of these nodular opacities demonstrates central lucency suggesting the possibility of cavitation. The findings could represent an infectious or neoplastic process given history. Recommend clinical correlation and follow-up to resolution. 2. Moderate right and small left effusions with underlying opacities. Electronically Signed   By: Dorise Bullion III M.D   On: 09-06-2016 14:29   Ct Chest Wo Contrast  Result Date: 2016/09/06 CLINICAL DATA:  Respiratory symptoms for 2 or 3 days. Started with sore throat, now shortness of breath. Being treated with chemotherapy for metastatic endometrial cancer. EXAM: CT CHEST WITHOUT CONTRAST  TECHNIQUE: Multidetector CT  imaging of the chest was performed following the standard protocol without IV contrast. COMPARISON:  Chest radiographs today. Abdominal CT 06/19/2010. No previous chest CT. FINDINGS: Cardiovascular: Mild aortic and coronary artery atherosclerosis. No acute vascular findings are demonstrated on noncontrast imaging. There is a right IJ central venous catheter with its tip in the mid SVC. The heart size is normal. There may be a small amount of pericardial fluid anteriorly. Mediastinum/Nodes: The mediastinum is shifted into the left hemithorax. No enlarged mediastinal, hilar or axillary lymph nodes are seen, although hilar evaluation is limited by the lack of intravenous contrast. The thyroid gland, trachea and esophagus demonstrate no significant findings. Lungs/Pleura: There is a very large pleural effusion on the right with resulting mediastinal shift to the left. There is a small dependent left pleural effusion. The right lower lobe is completely collapsed. There is dependent atelectasis in the right upper and middle lobes. There are patchy perihilar airspace opacities throughout the left lung, greatest in the upper lobe. No discrete pulmonary nodules are seen. Upper abdomen: The liver is shrunken with diffuse contour irregularity, consistent with advanced cirrhosis. Grossly stable lobularity of the spleen status post previous embolization procedure. Musculoskeletal/Chest wall: There is no chest wall mass or suspicious osseous finding. There are scattered small sclerotic lesions in the spine which are grossly stable. IMPRESSION: 1. Large right and small left pleural effusions. The right pleural effusion results in partial right lung collapse and mediastinal shift to the left. Right-sided thoracentesis or chest tube placement should be considered for diagnostic and therapeutic purposes. 2. Patchy airspace opacities throughout the aerated left lung, greatest in the upper lobe, suspicious  for pneumonia. 3. Morphologic changes of hepatic cirrhosis. Electronically Signed   By: Richardean Sale M.D.   On: 08/28/2016 15:23   Dg Chest Port 1 View  Result Date: September 06, 2016 CLINICAL DATA:  Acute respiratory failure EXAM: PORTABLE CHEST 1 VIEW COMPARISON:  08/13/2016 FINDINGS: Progression of diffuse bilateral airspace disease. Moderately large right pleural effusion and right lower lobe airspace disease unchanged. Minimal left effusion. Port-A-Cath tip in the SVC unchanged. IMPRESSION: Progression of extensive bilateral airspace disease. Moderate right effusion and right lower lobe airspace disease unchanged. Electronically Signed   By: Franchot Gallo M.D.   On: Sep 06, 2016 07:33   Dg Chest Port 1 View  Result Date: 08/13/2016 CLINICAL DATA:  Acute respiratory failure EXAM: PORTABLE CHEST 1 VIEW COMPARISON:  08/09/2016 FINDINGS: Right jugular port extends to the cavoatrial junction. Moderate right pleural effusion persists without significant change. Patchy multifocal airspace opacities persist. No pneumothorax. IMPRESSION: No significant interval change in the bilateral airspace opacities. Persistent right pleural effusion. Electronically Signed   By: Andreas Newport M.D.   On: 08/13/2016 04:36   US Thoracentesis Asp Pleural Space W/img Guide  Result Date: 08/26/2016 INDICATION: History of malignancy and cirrhosis, now with symptomatic right-sided sided pleural effusion. Please perform ultrasound-guided thoracentesis for diagnostic and therapeutic purposes EXAM: US THORACENTESIS ASP PLEURAL SPACE W/IMG GUIDE COMPARISON:  Chest radiograph - earlier same day ; chest CT - earlier same day MEDICATIONS: None. COMPLICATIONS: None immediate. TECHNIQUE: Informed written consent was obtained from the patient after a discussion of the risks, benefits and alternatives to treatment. A timeout was performed prior to the initiation of the procedure. With the patient positioned left lateral decubitus, initial  ultrasound scanning demonstrates a large right-sided pleural effusion. The right lateral lower chest was prepped and draped in the usual sterile fashion. 1% lidocaine was used for local anesthesia. An ultrasound image  was saved for documentation purposes. An 8 Fr Safe-T-Centesis catheter was introduced. The thoracentesis was performed. The catheter was removed and a dressing was applied. The patient tolerated the procedure well without immediate post procedural complication. The patient was escorted to have an upright chest radiograph. FINDINGS: A total of approximately 1.9 liters of serous fluid was removed. Requested samples were sent to the laboratory. IMPRESSION: Successful ultrasound-guided right sided thoracentesis yielding 1.9 liters of pleural fluid. Electronically Signed   By: Sandi Mariscal M.D.   On: 09/04/2016 17:27    Microbiology Recent Results (from the past 240 hour(s))  Blood culture (routine x 2)     Status: Abnormal   Collection Time: 08/26/2016  3:48 PM  Result Value Ref Range Status   Specimen Description BLOOD L AC  Final   Special Requests BOTTLES DRAWN AEROBIC AND ANAEROBIC BCAV  Final   Culture  Setup Time   Final    GRAM POSITIVE COCCI Organism ID to follow IN BOTH AEROBIC AND ANAEROBIC BOTTLES CRITICAL RESULT CALLED TO, READ BACK BY AND VERIFIED WITH: Aura Camps JAMES 08/13/16 1803 KLW    Culture (A)  Final    STAPHYLOCOCCUS SPECIES (COAGULASE NEGATIVE) THE SIGNIFICANCE OF ISOLATING THIS ORGANISM FROM A SINGLE SET OF BLOOD CULTURES WHEN MULTIPLE SETS ARE DRAWN IS UNCERTAIN. PLEASE NOTIFY THE MICROBIOLOGY DEPARTMENT WITHIN ONE WEEK IF SPECIATION AND SENSITIVITIES ARE REQUIRED. Performed at [facility], [place] [address], [place],  [address]    Report Status 08/25/2016 FINAL  Final  Blood Culture ID Panel (Reflexed)     Status: Abnormal   Collection Time: 08/25/2016  3:48 PM  Result Value Ref Range Status   Enterococcus species NOT DETECTED NOT DETECTED Final    Listeria monocytogenes NOT DETECTED NOT DETECTED Final   Staphylococcus species DETECTED (A) NOT DETECTED Final    Comment: Methicillin (oxacillin) resistant coagulase negative staphylococcus. Possible blood culture contaminant (unless isolated from more than one blood culture draw or clinical case suggests pathogenicity). No antibiotic treatment is indicated for blood  culture contaminants. CRITICAL RESULT CALLED TO, READ BACK BY AND VERIFIED WITH: Dicie Beam 08/13/16 1803 KLW    Staphylococcus aureus NOT DETECTED NOT DETECTED Final   Methicillin resistance DETECTED (A) NOT DETECTED Final    Comment: CRITICAL RESULT CALLED TO, READ BACK BY AND VERIFIED WITH: Dicie Beam 08/13/16 1803 KLW    Streptococcus species NOT DETECTED NOT DETECTED Final   Streptococcus agalactiae NOT DETECTED NOT DETECTED Final   Streptococcus pneumoniae NOT DETECTED NOT DETECTED Final   Streptococcus pyogenes NOT DETECTED NOT DETECTED Final   Acinetobacter baumannii NOT DETECTED NOT DETECTED Final   Enterobacteriaceae species NOT DETECTED NOT DETECTED Final   Enterobacter cloacae complex NOT DETECTED NOT DETECTED Final   Escherichia coli NOT DETECTED NOT DETECTED Final   Klebsiella oxytoca NOT DETECTED NOT DETECTED Final   Klebsiella pneumoniae NOT DETECTED NOT DETECTED Final   Proteus species NOT DETECTED NOT DETECTED Final   Serratia marcescens NOT DETECTED NOT DETECTED Final   Haemophilus influenzae NOT DETECTED NOT DETECTED Final   Neisseria meningitidis NOT DETECTED NOT DETECTED Final   Pseudomonas aeruginosa NOT DETECTED NOT DETECTED Final   Candida albicans NOT DETECTED NOT DETECTED Final   Candida glabrata NOT DETECTED NOT DETECTED Final   Candida krusei NOT DETECTED NOT DETECTED Final   Candida parapsilosis NOT DETECTED NOT DETECTED Final   Candida tropicalis NOT DETECTED NOT DETECTED Final  Body fluid culture     Status: None (Preliminary result)  Collection Time: 08/23/2016  4:50 PM  Result Value  Ref Range Status   Specimen Description PLEURAL  Final   Special Requests NONE  Final   Gram Stain   Final    RARE WBC PRESENT, PREDOMINANTLY PMN FEW WBC PRESENT, PREDOMINANTLY MONONUCLEAR NO ORGANISMS SEEN    Culture   Final    NO GROWTH 3 DAYS Performed at [facility] [address], [place], [place] [address]    Report Status PENDING  Incomplete  Blood culture (routine x 2)     Status: None (Preliminary result)   Collection Time: 09/06/2016  9:03 PM  Result Value Ref Range Status   Specimen Description BLOOD LEFT ARM  Final   Special Requests   Final    BOTTLES DRAWN AEROBIC AND ANAEROBIC ANA 1CC AER 2CC   Culture NO GROWTH 3 DAYS  Final   Report Status PENDING  Incomplete  MRSA PCR Screening     Status: None   Collection Time: 08/14/2016 11:00 PM  Result Value Ref Range Status   MRSA by PCR NEGATIVE NEGATIVE Final    Comment:        The GeneXpert MRSA Assay (FDA approved for NASAL specimens only), is one component of a comprehensive MRSA colonization surveillance program. It is not intended to diagnose MRSA infection nor to guide or monitor treatment for MRSA infections.     Lab Basic Metabolic Panel:  Recent Labs Lab 08/17/2016 1326 08/13/16 0419 08/13/16 0830 08/14/16 0414 2016-09-09 0404  NA 134* 136  --  136 132*  K 3.6 3.3*  --  3.4* 4.3  CL 102 104  --  106 105  CO2 24 22  --  23 17*  GLUCOSE 163* 144*  --  116* 149*  BUN 35* 34*  --  33* 42*  CREATININE 0.99 1.13*  --  1.10* 1.34*  CALCIUM 7.4* 6.8*  --  7.2* 7.7*  MG  --   --  1.5* 2.6* 2.4  PHOS  --   --   --   --  2.8   Liver Function Tests:  Recent Labs Lab 08/11/2016 1326 08/13/16 0830 2016/09/09 0404  AST 25  --  28  ALT 9*  --  8*  ALKPHOS 73  --  66  BILITOT 1.5*  --  1.6*  PROT 5.2*  --  4.8*  ALBUMIN 1.8* 1.6* 2.3*   No results for input(s): LIPASE, AMYLASE in the last 168 hours. No results for input(s): AMMONIA in the last 168 hours. CBC:  Recent Labs Lab 08/19/2016 1326  08/13/16 0419 08/14/16 0414 09-09-16 0404  WBC 10.4 10.3 8.2 15.1*  NEUTROABS 8.9*  --   --  13.4*  HGB 10.4* 9.6* 8.1* 9.1*  HCT 30.8* 28.7* 23.7* 27.8*  MCV 99.0 100.3* 99.5 100.3*  PLT 222 170 120* 152   Cardiac Enzymes:  Recent Labs Lab 09/01/2016 1326  TROPONINI <0.03   Sepsis Labs:  Recent Labs Lab 09/06/2016 1326 09/05/2016 1637 08/13/16 0317 08/13/16 0419 08/13/16 0450 08/14/16 0414 09/09/16 0404  PROCALCITON  --   --   --   --  0.16 0.29 0.58  WBC 10.4  --   --  10.3  --  8.2 15.1*  LATICACIDVEN  --  2.8* 3.6*  --   --   --   --     Procedures/Operations  none   Jerame Hedding 08/16/2016, 6:47 AM

## 2016-09-08 NOTE — Progress Notes (Signed)
Pt washed, placed in bag.  Left floor via stretcher 2150.

## 2016-09-08 NOTE — Progress Notes (Signed)
Chaplain received an end of life order. Patient seemed very sick but she was able to communicate with her family members and Chaplain. One of the family members said that patient was a devoted [place] and she would appreciate Chaplain's prayer. Chaplain held hands with family members and patient while praying for the patient. Chaplain assured patient and family that he would be available for them after praying and spending sometimes with patient and family members.

## 2016-09-08 NOTE — Progress Notes (Signed)
Patient's labored breathing has worsened, RR 30-40x per minute. Hinton Dyer, NP notified. Wilnette Kales

## 2016-09-08 NOTE — Progress Notes (Addendum)
Per Dr. Ashby Dawes d/c midodrine and place orders to transfer patient to medsurg for comfort care. Wilnette Kales
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
## 2016-09-08 NOTE — Progress Notes (Signed)
PT Cancellation Note  Patient Details Name: Elizabeth Monroe MRN: [id number] DOB: 06-23-1950   Cancelled Treatment:    Reason Eval/Treat Not Completed: Medical issues which prohibited therapy Spoke with nursing who reports that pt's HR has been elevated, she has had SOB with HFNC and generally is not doing well today.  She suggested to hold PT today and check back at a later date to see if pt is more stable and appropriate.   Kreg Shropshire, DPT 2016/08/22, 2:42 PM

## 2016-09-08 NOTE — Progress Notes (Signed)
Advance Care Planning:   Pt evaluated, appears dyspneic, RR around 30; Pt lethargic, arousable but quickly falls asleep. Does not appear to understand/comprehend what I ask her.  Lung sound reduced bilaterally with bilateral crackles.  Pt appears gaunt, malnourished, petechial hemorrhages over much of body.   D/w pt's daughter and friend, at bedside. pt appears at the point that she requires intubation. Attempted to communicate with patient but she does not appear to be awake enough to understand. Given her endstage/terminal disease status she will likely pass in next few days. We can keep her alive longer but I am very doubtful that she will be able to survive and be able to come off the ventilator, she will likely end up coding and undergo CPR on the ventilator. I think that given her likely impending death that she pass away while focusing on her comfort and surrounded by family rather than on the ventilator.  I discussed above with pt's grandson who is the HCPOA. He consented to allow [place] to change the patients status to DNR with CMO.   Marda Stalker, M.D.  August 26, 2016 30 min spent in discussion.

## 2016-09-08 NOTE — Progress Notes (Signed)
On arrival to shift patient's breathing pattern appeared labored and oxygen saturations on [place] 4L was 86-88%. Patient oxygen increased to  5L, sats 90%. R sided breath sounds diminished, L sided breath sounds slight wheezing in upper lobe. [place], NP notified and gave verbal order for bipap. RRT attempted to place patient on bipap and patient refused, patient placed on non rebreather instead. Currently on 15L non rebreather with saturations of 99%. Wilnette Kales

## 2016-09-08 DEATH — deceased
